# Patient Record
Sex: Male | Born: 1945 | Race: White | Marital: Married | Smoking: Former smoker
Health system: Southern US, Community
[De-identification: ages and names within clinical notes are randomized; demographics above are authoritative.]

## PROBLEM LIST (undated history)

## (undated) DIAGNOSIS — H269 Unspecified cataract: Secondary | ICD-10-CM

## (undated) DIAGNOSIS — E291 Testicular hypofunction: Secondary | ICD-10-CM

## (undated) DIAGNOSIS — F419 Anxiety disorder, unspecified: Secondary | ICD-10-CM

## (undated) DIAGNOSIS — K219 Gastro-esophageal reflux disease without esophagitis: Secondary | ICD-10-CM

## (undated) DIAGNOSIS — M5416 Radiculopathy, lumbar region: Secondary | ICD-10-CM

## (undated) DIAGNOSIS — Z87442 Personal history of urinary calculi: Secondary | ICD-10-CM

## (undated) DIAGNOSIS — E039 Hypothyroidism, unspecified: Secondary | ICD-10-CM

## (undated) DIAGNOSIS — E7800 Pure hypercholesterolemia, unspecified: Secondary | ICD-10-CM

## (undated) DIAGNOSIS — G2581 Restless legs syndrome: Secondary | ICD-10-CM

## (undated) DIAGNOSIS — I499 Cardiac arrhythmia, unspecified: Secondary | ICD-10-CM

## (undated) DIAGNOSIS — N189 Chronic kidney disease, unspecified: Secondary | ICD-10-CM

## (undated) DIAGNOSIS — I739 Peripheral vascular disease, unspecified: Secondary | ICD-10-CM

## (undated) DIAGNOSIS — I6529 Occlusion and stenosis of unspecified carotid artery: Secondary | ICD-10-CM

## (undated) DIAGNOSIS — I10 Essential (primary) hypertension: Secondary | ICD-10-CM

## (undated) DIAGNOSIS — J189 Pneumonia, unspecified organism: Secondary | ICD-10-CM

## (undated) DIAGNOSIS — K759 Inflammatory liver disease, unspecified: Secondary | ICD-10-CM

## (undated) DIAGNOSIS — D649 Anemia, unspecified: Secondary | ICD-10-CM

## (undated) DIAGNOSIS — E119 Type 2 diabetes mellitus without complications: Secondary | ICD-10-CM

## (undated) DIAGNOSIS — M545 Low back pain, unspecified: Secondary | ICD-10-CM

## (undated) DIAGNOSIS — M1990 Unspecified osteoarthritis, unspecified site: Secondary | ICD-10-CM

## (undated) DIAGNOSIS — N400 Enlarged prostate without lower urinary tract symptoms: Secondary | ICD-10-CM

## (undated) HISTORY — PX: KIDNEY STONE SURGERY: SHX686

## (undated) HISTORY — PX: PENILE PROSTHESIS IMPLANT: SHX240

## (undated) HISTORY — PX: EXTRACORPOREAL SHOCK WAVE LITHOTRIPSY: SHX1557

## (undated) HISTORY — PX: BACK SURGERY: SHX140

---

## 2020-01-01 ENCOUNTER — Other Ambulatory Visit: Payer: Self-pay | Admitting: Orthopedic Surgery

## 2020-01-14 ENCOUNTER — Other Ambulatory Visit: Payer: Self-pay | Admitting: Orthopedic Surgery

## 2020-01-14 DIAGNOSIS — M48062 Spinal stenosis, lumbar region with neurogenic claudication: Secondary | ICD-10-CM

## 2020-01-20 ENCOUNTER — Ambulatory Visit
Admission: RE | Admit: 2020-01-20 | Discharge: 2020-01-20 | Disposition: A | Discharge status: Home / Self Care | Payer: Medicare HMO | Source: Ambulatory Visit | Attending: Orthopedic Surgery | Admitting: Orthopedic Surgery

## 2020-01-20 ENCOUNTER — Other Ambulatory Visit: Payer: Self-pay | Admitting: Orthopedic Surgery

## 2020-01-20 DIAGNOSIS — M48062 Spinal stenosis, lumbar region with neurogenic claudication: Secondary | ICD-10-CM

## 2020-02-16 NOTE — Progress Notes (Signed)
[id number] - [place], [place] - [address] [place] [address] Phone: [phone] Fax: [phone]      Your procedure is scheduled on Wednesday, December 29th.  Report to [facility] Main Entrance "A" at 5:30 A.M., and check in at the Admitting office.  Call this number if you have problems the morning of surgery:  [phone]  Call [phone] if you have any questions prior to your surgery date Monday-Friday 8am-4pm    Remember:  Do not eat after midnight the night before your surgery  You may drink clear liquids until 4:30 AM the morning of your surgery.   Clear liquids allowed are: Water, Non-Citrus Juices (without pulp), Carbonated Beverages, Clear Tea, Black Coffee Only, and Gatorade  Please finish the bottled water by 4:30 AM, the morning of surgery.  Nothing else to drink after you finish the bottled water.     Take these medicines the morning of surgery with A SIP OF WATER   Tylenol - if needed  Allopurinol (Zyloprim)  Amlodipine (Norvasc)  Atorvastatin (Lipitor)  Carvedilol (Coreg)  Clonazepam (Klonopin)  Benadryl - if needed  Colace  Famotidine (Pepcid)  Hydralazine (Apresoline)  Doxazosin (Cardura)  Levothyroxine (Synthroid)  Narcan - if needed  Oxycodone - if needed for pain   Follow your surgeon's instructions on when to stop Aspirin & Plavix.  If no instructions were given by your surgeon then you will need to call the office to get those instructions.    As of today, STOP taking any Aspirin (unless otherwise instructed by your surgeon) Aleve, Naproxen, Ibuprofen, Motrin, Advil, Goody's, BC's, all herbal medications, fish oil, and all vitamins.   WHAT DO I DO ABOUT MY DIABETES MEDICATION?   Marland Kitchen Do not take oral diabetes medicines (pills) the morning of surgery. Glipizide (glucotrol) or Linagliptin (Tradjenta)   HOW TO MANAGE YOUR DIABETES BEFORE AND AFTER SURGERY  Why is it important to control my blood sugar  before and after surgery? . Improving blood sugar levels before and after surgery helps healing and can limit problems. . A way of improving blood sugar control is eating a healthy diet by: o  Eating less sugar and carbohydrates o  Increasing activity/exercise o  Talking with your doctor about reaching your blood sugar goals . High blood sugars (greater than 180 mg/dL) can raise your risk of infections and slow your recovery, so you will need to focus on controlling your diabetes during the weeks before surgery. . Make sure that the doctor who takes care of your diabetes knows about your planned surgery including the date and location.  How do I manage my blood sugar before surgery? . Check your blood sugar at least 4 times a day, starting 2 days before surgery, to make sure that the level is not too high or low. . Check your blood sugar the morning of your surgery when you wake up and every 2 hours until you get to the Short Stay unit. o If your blood sugar is less than 70 mg/dL, you will need to treat for low blood sugar: - Do not take insulin. - Treat a low blood sugar (less than 70 mg/dL) with  cup of clear juice (cranberry or apple), 4 glucose tablets, OR glucose gel. - Recheck blood sugar in 15 minutes after treatment (to make sure it is greater than 70 mg/dL). If your blood sugar is not greater than 70 mg/dL on recheck, call [phone] for further instructions. . Report your  blood sugar to the short stay nurse when you get to Short Stay.  . If you are admitted to the hospital after surgery: o Your blood sugar will be checked by the staff and you will probably be given insulin after surgery (instead of oral diabetes medicines) to make sure you have good blood sugar levels. o The goal for blood sugar control after surgery is 80-180 mg/dL.               DAY OF SURGERY:                    Do not wear jewelry            Do not wear lotions, powders, colognes, or deodorant.             Men may shave face and neck.            Do not bring valuables to the hospital.            [facility] is not responsible for any belongings or valuables.  Do NOT Smoke (Tobacco/Vaping) or drink Alcohol 24 hours prior to your procedure If you use a CPAP at night, you may bring all equipment for your overnight stay.   Contacts, glasses, dentures or bridgework may not be worn into surgery.      For patients admitted to the hospital, discharge time will be determined by your treatment team.   Patients discharged the day of surgery will not be allowed to drive home, and someone needs to stay with them for 24 hours.    Special instructions:   [place]- Preparing For Surgery  Before surgery, you can play an important role. Because skin is not sterile, your skin needs to be as free of germs as possible. You can reduce the number of germs on your skin by washing with CHG (chlorahexidine gluconate) Soap before surgery.  CHG is an antiseptic cleaner which kills germs and bonds with the skin to continue killing germs even after washing.    Oral Hygiene is also important to reduce your risk of infection.  Remember - BRUSH YOUR TEETH THE MORNING OF SURGERY WITH YOUR REGULAR TOOTHPASTE  Please do not use if you have an allergy to CHG or antibacterial soaps. If your skin becomes reddened/irritated stop using the CHG.  Do not shave (including legs and underarms) for at least 48 hours prior to first CHG shower. It is OK to shave your face.  Please follow these instructions carefully.   1. Shower the NIGHT BEFORE SURGERY and the MORNING OF SURGERY with CHG Soap.   2. If you chose to wash your hair, wash your hair first as usual with your normal shampoo.  3. After you shampoo, rinse your hair and body thoroughly to remove the shampoo.  4. Use CHG as you would any other liquid soap. You can apply CHG directly to the skin and wash gently with a scrungie or a clean washcloth.   5. Apply the CHG Soap  to your body ONLY FROM THE NECK DOWN.  Do not use on open wounds or open sores. Avoid contact with your eyes, ears, mouth and genitals (private parts). Wash Face and genitals (private parts)  with your normal soap.   6. Wash thoroughly, paying special attention to the area where your surgery will be performed.  7. Thoroughly rinse your body with warm water from the neck down.  8. DO NOT shower/wash with your normal soap  after using and rinsing off the CHG Soap.  9. Pat yourself dry with a CLEAN TOWEL.  10. Wear CLEAN PAJAMAS to bed the night before surgery  11. Place CLEAN SHEETS on your bed the night of your first shower and DO NOT SLEEP WITH PETS.   Day of Surgery: Wear Clean/Comfortable clothing the morning of surgery Do not apply any deodorants/lotions.   Remember to brush your teeth WITH YOUR REGULAR TOOTHPASTE.   Please read over the following fact sheets that you were given.

## 2020-02-17 ENCOUNTER — Encounter (HOSPITAL_COMMUNITY): Payer: Self-pay

## 2020-02-17 ENCOUNTER — Other Ambulatory Visit: Payer: Self-pay

## 2020-02-17 ENCOUNTER — Encounter (HOSPITAL_COMMUNITY)
Admission: RE | Admit: 2020-02-17 | Discharge: 2020-02-17 | Disposition: A | Discharge status: Home / Self Care | Payer: Medicare HMO | Source: Ambulatory Visit | Attending: Orthopedic Surgery | Admitting: Orthopedic Surgery

## 2020-02-17 DIAGNOSIS — Z01812 Encounter for preprocedural laboratory examination: Secondary | ICD-10-CM | POA: Diagnosis present

## 2020-02-17 HISTORY — DX: Restless legs syndrome: G25.81

## 2020-02-17 HISTORY — DX: Gastro-esophageal reflux disease without esophagitis: K21.9

## 2020-02-17 HISTORY — DX: Benign prostatic hyperplasia without lower urinary tract symptoms: N40.0

## 2020-02-17 HISTORY — DX: Pure hypercholesterolemia, unspecified: E78.00

## 2020-02-17 HISTORY — DX: Hypothyroidism, unspecified: E03.9

## 2020-02-17 HISTORY — DX: Low back pain, unspecified: M54.50

## 2020-02-17 HISTORY — DX: Unspecified osteoarthritis, unspecified site: M19.90

## 2020-02-17 HISTORY — DX: Anemia, unspecified: D64.9

## 2020-02-17 HISTORY — DX: Essential (primary) hypertension: I10

## 2020-02-17 HISTORY — DX: Chronic kidney disease, unspecified: N18.9

## 2020-02-17 HISTORY — DX: Occlusion and stenosis of unspecified carotid artery: I65.29

## 2020-02-17 HISTORY — DX: Peripheral vascular disease, unspecified: I73.9

## 2020-02-17 HISTORY — DX: Anxiety disorder, unspecified: F41.9

## 2020-02-17 HISTORY — DX: Pneumonia, unspecified organism: J18.9

## 2020-02-17 HISTORY — DX: Testicular hypofunction: E29.1

## 2020-02-17 HISTORY — DX: Radiculopathy, lumbar region: M54.16

## 2020-02-17 HISTORY — DX: Type 2 diabetes mellitus without complications: E11.9

## 2020-02-17 HISTORY — DX: Unspecified cataract: H26.9

## 2020-02-17 HISTORY — DX: Personal history of urinary calculi: Z87.442

## 2020-02-17 LAB — URINALYSIS, ROUTINE W REFLEX MICROSCOPIC
Bacteria, UA: NONE SEEN
Bilirubin Urine: NEGATIVE
Glucose, UA: NEGATIVE mg/dL
Hgb urine dipstick: NEGATIVE
Ketones, ur: NEGATIVE mg/dL
Leukocytes,Ua: NEGATIVE
Nitrite: NEGATIVE
Protein, ur: 100 mg/dL — AB
Specific Gravity, Urine: 1.017 (ref 1.005–1.030)
pH: 5 (ref 5.0–8.0)

## 2020-02-17 LAB — CBC WITH DIFFERENTIAL/PLATELET
Abs Immature Granulocytes: 0.02 10*3/uL (ref 0.00–0.07)
Basophils Absolute: 0 10*3/uL (ref 0.0–0.1)
Basophils Relative: 1 %
Eosinophils Absolute: 0.1 10*3/uL (ref 0.0–0.5)
Eosinophils Relative: 2 %
HCT: 34.5 % — ABNORMAL LOW (ref 39.0–52.0)
Hemoglobin: 10.7 g/dL — ABNORMAL LOW (ref 13.0–17.0)
Immature Granulocytes: 1 %
Lymphocytes Relative: 25 %
Lymphs Abs: 0.9 10*3/uL (ref 0.7–4.0)
MCH: 28.8 pg (ref 26.0–34.0)
MCHC: 31 g/dL (ref 30.0–36.0)
MCV: 92.7 fL (ref 80.0–100.0)
Monocytes Absolute: 0.3 10*3/uL (ref 0.1–1.0)
Monocytes Relative: 8 %
Neutro Abs: 2.3 10*3/uL (ref 1.7–7.7)
Neutrophils Relative %: 63 %
Platelets: 193 10*3/uL (ref 150–400)
RBC: 3.72 MIL/uL — ABNORMAL LOW (ref 4.22–5.81)
RDW: 14.7 % (ref 11.5–15.5)
WBC: 3.5 10*3/uL — ABNORMAL LOW (ref 4.0–10.5)
nRBC: 0 % (ref 0.0–0.2)

## 2020-02-17 LAB — TYPE AND SCREEN
ABO/RH(D): O POS
Antibody Screen: NEGATIVE

## 2020-02-17 LAB — COMPREHENSIVE METABOLIC PANEL
ALT: 38 U/L (ref 0–44)
AST: 46 U/L — ABNORMAL HIGH (ref 15–41)
Albumin: 3.4 g/dL — ABNORMAL LOW (ref 3.5–5.0)
Alkaline Phosphatase: 59 U/L (ref 38–126)
Anion gap: 9 (ref 5–15)
BUN: 26 mg/dL — ABNORMAL HIGH (ref 8–23)
CO2: 25 mmol/L (ref 22–32)
Calcium: 8.9 mg/dL (ref 8.9–10.3)
Chloride: 105 mmol/L (ref 98–111)
Creatinine, Ser: 2.6 mg/dL — ABNORMAL HIGH (ref 0.61–1.24)
GFR, Estimated: 25 mL/min — ABNORMAL LOW (ref >60)
Glucose, Bld: 156 mg/dL — ABNORMAL HIGH (ref 70–99)
Potassium: 4.5 mmol/L (ref 3.5–5.1)
Sodium: 139 mmol/L (ref 135–145)
Total Bilirubin: 0.4 mg/dL (ref 0.3–1.2)
Total Protein: 6.1 g/dL — ABNORMAL LOW (ref 6.5–8.1)

## 2020-02-17 LAB — PROTIME-INR
INR: 1 (ref 0.8–1.2)
Prothrombin Time: 13.1 seconds (ref 11.4–15.2)

## 2020-02-17 LAB — SURGICAL PCR SCREEN
MRSA, PCR: NEGATIVE
Staphylococcus aureus: NEGATIVE

## 2020-02-17 LAB — GLUCOSE, CAPILLARY: Glucose-Capillary: 136 mg/dL — ABNORMAL HIGH (ref 70–99)

## 2020-02-17 LAB — APTT: aPTT: 32 seconds (ref 24–36)

## 2020-02-17 NOTE — Progress Notes (Signed)
PCP - Dr. Vista Lawman Cardiologist - patient states he no longer sees a cardiologist Vascular - Sonia Baller Christman Pain - Dr. Ulice Brilliant  PPM/ICD - n/a Device Orders -  Rep Notified -   Chest x-ray - n/a EKG - 03/10/19 in [facility], requested tracing Stress Test - 2019 ECHO - 2019 Cardiac Cath - patient states he possibly had one 15+ years ago but is not sure, thinks it was done in [place] - requested records from [facility]  Sleep Study - n/a CPAP -   Fasting Blood Sugar - patient does not check CBG.  Last A1c was 01/05/2020 6.2 Checks Blood Sugar _____ times a day  Blood Thinner Instructions: stop plavix 7 days prior to surgery Aspirin Instructions: stop aspirin 7 days prior to surgery  ERAS Protcol - clears until 0430 PRE-SURGERY Ensure or G2- water given to patient to be completed by 0430 DOS  COVID TEST- scheduled for 02/22/2020, patient educated about quarantining process   Anesthesia review: yes, history of cardiac testing  Patient denies shortness of breath, fever, cough and chest pain at PAT appointment   All instructions explained to the patient, with a verbal understanding of the material. Patient agrees to go over the instructions while at home for a better understanding. Patient also instructed to self quarantine after being tested for COVID-19. The opportunity to ask questions was provided.

## 2020-02-18 NOTE — Progress Notes (Signed)
Anesthesia Chart Review:  Case: [id number] Date/Time: 02/24/20 0715   Procedure: LEFT LATERAL LUMBAR 3-LUMBAR 4 INTERBODY FUSION WITH INSTRUMENTATION AND ALLOGRAFT (Left ) - 3 C BED   Anesthesia type: General   Pre-op diagnosis: NEUROGENIC CLAUDICATION SECONDARY TO SUPERIOR ADJACENT SEGMENT MODERATE TO SEVERE SPINAL STENOSIS   Location: [facility] OR ROOM 05 / [place] OR   Surgeons: Phylliss Bob, MD      DISCUSSION: Patient is a 74 year old male scheduled for the above procedure.  He is also scheduled for posterior spinal fusion L3-4 with instrumentation allograft on 02/25/2020.  History includes former smoker, HTN, PAD/claudication (s/p right SFA stent), carotid artery stenosis (60-79% BICA 11/2019, 6 month f/u), hypercholesterolemia, DM2, GERD, hypothyroidism, CKD, anemia, BPH, penile prosthesis implant, RLS, back surgery (revision L4-S1 laminectomy and PSF 08/16/18, Ivan Croft, MD), + COVID 02/14/19.   He reported instructions to hold Plavix and aspirin 7 days prior to surgery.  Cr 2.60, but consistent with known CKD/previous labs since 09/2019.  Presurgical COVID-19 test is scheduled for 02/22/2020.  Anesthesia team to evaluate on the day of surgery.   VS: BP 130/60   Pulse 94   Temp 36.7 C   Resp 18   Ht 5' 10"  (1.778 m)   Wt 94.2 kg   SpO2 98%   BMI 29.80 kg/m    PROVIDERS: Haimes, Youlanda Roys, MD his PCP [facility]).  Last visit 02/05/2020 for increasing anxiety in regards to upcoming back surgery.  Clonazepam increased from twice daily to 3 times daily. Charlane Ferretti, [place], PA-C with vascular surgeon [facility]). Last visit 12/03/19 for follow-up PAD, carotid disease. 6 month follow-up planned.Kriste Basque, MD Pain management - Linnell Fulling, MD is nephrologist [facility]).  Last visit 01/05/2020 for follow-up CKD stage IIIb.   LABS: Preoperative labs noted. Cr 2.60, but consistent with known CKD/prior labs since 09/2019. H/H 10.7/34.5, down from 11.8/35.4 on 01/05/20. T&S  ordered. A1c 6.2% 01/05/20 ([facility]).  (all labs ordered are listed, but only abnormal results are displayed)  Labs Reviewed  GLUCOSE, CAPILLARY - Abnormal; Notable for the following components:      Result Value   Glucose-Capillary 136 (*)    All other components within normal limits  CBC WITH DIFFERENTIAL/PLATELET - Abnormal; Notable for the following components:   WBC 3.5 (*)    RBC 3.72 (*)    Hemoglobin 10.7 (*)    HCT 34.5 (*)    All other components within normal limits  COMPREHENSIVE METABOLIC PANEL - Abnormal; Notable for the following components:   Glucose, Bld 156 (*)    BUN 26 (*)    Creatinine, Ser 2.60 (*)    Total Protein 6.1 (*)    Albumin 3.4 (*)    AST 46 (*)    GFR, Estimated 25 (*)    All other components within normal limits  URINALYSIS, ROUTINE W REFLEX MICROSCOPIC - Abnormal; Notable for the following components:   Protein, ur 100 (*)    All other components within normal limits  SURGICAL PCR SCREEN  PROTIME-INR  APTT  TYPE AND SCREEN   01/05/20 Cr 2.35, BUN 34, eGFR 26/#1  10/16/19 Cr 2.53, BUN 34, eGFR 24/28  10/02/19 Cr 2.61, BUN 35, eGFR 23   IMAGES: CT L-spine 01/20/20: IMPRESSION: - Status post L4-S1 fusion. Hardware is intact and there is some incorporation of interbody spacers into the endplates. Endplate spurring results in moderate to moderately severe bilateral foraminal narrowing at L5-S1, worse on the left.  There is also mild to moderate foraminal narrowing at L4-5. The central canal is open at both levels. - Moderately severe central canal and mild bilateral foraminal narrowing at L3-4. - Moderate central canal stenosis L2-3. - Aortic Atherosclerosis (ICD10-I70.0).  MRI L-spine 11/30/19: IMPRESSION:  1. Interval postsurgical changes from L4-S1 PLIF. No residual canal  stenosis at these levels. Suspect at least mild residual foraminal  stenosis at L5-S1, left worse than right, although evaluation of  which is limited  by adjacent susceptibility artifact.  2. Moderate-to-severe canal stenosis and mild-to-moderate bilateral  foraminal stenosis at L3-L4, progressed from prior.  3. Mild canal stenosis at L2-L3 with mild bilateral subarticular  recess stenosis, minimally progressed from prior.    CT Chest 03/20/19 [facility]) IMPRESSION:  1. The appearance of the lungs is most compatible with a resolving  multilobar pneumonia, likely from a viral infection, with areas of  developing post infectious fibrosis, as detailed above.  2. Aortic atherosclerosis, in addition to left main and 3 vessel  coronary artery disease. Assessment for potential risk factor  modification, dietary therapy or pharmacologic therapy may be  warranted, if clinically indicated.    EKG: 03/10/2019 [facility]): Sinus rhythm   CV: Carotid US 12/03/19: As outlined by Lynwood Dawley, PA-C, "Carotid duplex scan reveals a 60 to 79% stenosis of the bilateral internal carotid arteries. This is a progression on the right side but I feel based on his velocities that he is at the lower end of this range. His vertebrals are antegrade and his brachial pressures are symmetric." Six month follow-up planned.   LE Arterial US 06/02/19:As outlined by Charlane Ferretti, [place], PA-C, "Lower extremity arterial imaging on the right reveals the right mid to distal SFA stent to be patent with velocities of 278 cm/s which is stable. ABIs are also stable at 0.85 on the right and 0.71 on the left." 1 year follow-up planned.   Stress Echo 11/01/17 [facility]): Global LVEF (rest): Normal (LVEF >50%)  Global LVEF (stress): Hyperkinetic (LVEF >70%)  Conclusions  Summary  Normal Stress Echocardiogram with no ischemia suggested by echo images    According to 10/21/17 cardiology consult note by Conrad Olympia Fields, MD [facility]), cardiac cath ~ 2004 showed "minor CAD". Stress echo was ordered for chest "aching" and was normal 11/01/17.      Past Medical History:  Diagnosis  Date  . Anemia   . Anxiety   . BPH (benign prostatic hyperplasia)   . Carotid artery stenosis   . Cataracts, bilateral   . Chronic kidney disease   . Diabetes mellitus without complication ([place])   . GERD (gastroesophageal reflux disease)   . History of kidney stones   . Hypercholesteremia   . Hypertension   . Hypogonadism in male   . Hypothyroidism   . Low back pain   . Lumbar radiculopathy   . Osteoarthritis   . Peripheral vascular disease ([place])   . Pneumonia   . RLS (restless legs syndrome)     Past Surgical History:  Procedure Laterality Date  . BACK SURGERY     x4  . EXTRACORPOREAL SHOCK WAVE LITHOTRIPSY    . KIDNEY STONE SURGERY    . PENILE PROSTHESIS IMPLANT      MEDICATIONS: . acetaminophen (TYLENOL) 500 MG tablet  . allopurinol (ZYLOPRIM) 100 MG tablet  . amLODipine (NORVASC) 10 MG tablet  . APPLE CIDER VINEGAR PO  . aspirin EC 81 MG tablet  . atorvastatin (LIPITOR) 80 MG tablet  . carvedilol (COREG)  25 MG tablet  . clonazePAM (KLONOPIN) 1 MG tablet  . clopidogrel (PLAVIX) 75 MG tablet  . diphenhydrAMINE (BENADRYL) 25 MG tablet  . docusate sodium (COLACE) 100 MG capsule  . doxazosin (CARDURA) 4 MG tablet  . famotidine (PEPCID) 20 MG tablet  . fenofibrate 160 MG tablet  . fentaNYL (DURAGESIC) 12 MCG/HR  . fentaNYL (DURAGESIC) 25 MCG/HR  . ferrous sulfate 325 (65 FE) MG tablet  . FIBER PO  . glipiZIDE (GLUCOTROL XL) 10 MG 24 hr tablet  . hydrALAZINE (APRESOLINE) 25 MG tablet  . levothyroxine (SYNTHROID) 75 MCG tablet  . linagliptin (TRADJENTA) 5 MG TABS tablet  . lisinopril (ZESTRIL) 40 MG tablet  . Magnesium 400 MG TABS  . Melatonin 5 MG CAPS  . Multiple Vitamin (MULTIVITAMIN WITH MINERALS) TABS tablet  . naloxone (NARCAN) 4 MG/0.1ML LIQD nasal spray kit  . Omega-3 Fatty Acids (FISH OIL) 1000 MG CAPS  . Oxycodone HCl 10 MG TABS  . Potassium Citrate 15 MEQ (1620 MG) TBCR  . Testosterone 1.62 % GEL  . traZODone (DESYREL) 50 MG tablet  . TURMERIC PO    No current facility-administered medications for this encounter.    Myra Gianotti, PA-C Surgical Short Stay/Anesthesiology [facility] Phone [phone] [facility] Phone [phone] 02/18/2020 4:07 PM

## 2020-02-18 NOTE — Anesthesia Preprocedure Evaluation (Addendum)
Anesthesia Evaluation  Patient identified by MRN, date of birth, ID band Patient awake    Reviewed: Allergy & Precautions, NPO status , Patient's Chart, lab work & pertinent test results  Airway Mallampati: I  TM Distance: >3 FB Neck ROM: Full    Dental no notable dental hx. (+) Upper Dentures, Lower Dentures   Pulmonary former smoker,    Pulmonary exam normal breath sounds clear to auscultation       Cardiovascular Exercise Tolerance: Good hypertension, Pt. on medications + CAD and + Peripheral Vascular Disease  Normal cardiovascular exam Rhythm:Regular Rate:Normal  NL EF   Neuro/Psych Anxiety  Neuromuscular disease    GI/Hepatic Neg liver ROS, GERD  ,  Endo/Other  diabetes, Type 2Hypothyroidism   Renal/GU Renal InsufficiencyRenal diseaseCr 2.60 K+ 4.5     Musculoskeletal  (+) Arthritis ,   Abdominal   Peds  Hematology  (+) anemia , Hgb 10.7   Anesthesia Other Findings   Reproductive/Obstetrics                           Anesthesia Physical Anesthesia Plan  ASA: III  Anesthesia Plan: General   Post-op Pain Management:    Induction: Intravenous  PONV Risk Score and Plan: 3 and Treatment may vary due to age or medical condition, Ondansetron, Dexamethasone, TIVA and Midazolam  Airway Management Planned: Oral ETT  Additional Equipment: None and Arterial line  Intra-op Plan:   Post-operative Plan: Extubation in OR  Informed Consent: I have reviewed the patients History and Physical, chart, labs and discussed the procedure including the risks, benefits and alternatives for the proposed anesthesia with the patient or authorized representative who has indicated his/her understanding and acceptance.     Dental advisory given and Interpreter used for interveiw  Plan Discussed with: CRNA and Anesthesiologist  Anesthesia Plan Comments: (PAT note written 02/18/2020 by Myra Gianotti, PA-C.  2 large IV + Aline)    Anesthesia Quick Evaluation

## 2020-02-22 ENCOUNTER — Other Ambulatory Visit (HOSPITAL_COMMUNITY)
Admission: RE | Admit: 2020-02-22 | Discharge: 2020-02-22 | Disposition: A | Discharge status: Home / Self Care | Payer: Medicare HMO | Source: Ambulatory Visit | Attending: Orthopedic Surgery | Admitting: Orthopedic Surgery

## 2020-02-22 DIAGNOSIS — Z20822 Contact with and (suspected) exposure to covid-19: Secondary | ICD-10-CM | POA: Insufficient documentation

## 2020-02-22 DIAGNOSIS — Z01812 Encounter for preprocedural laboratory examination: Secondary | ICD-10-CM | POA: Insufficient documentation

## 2020-02-22 LAB — SARS CORONAVIRUS 2 (TAT 6-24 HRS): SARS Coronavirus 2: NEGATIVE

## 2020-02-24 ENCOUNTER — Inpatient Hospital Stay (HOSPITAL_COMMUNITY)
Admission: RE | Disposition: A | Discharge status: Home / Self Care | Payer: Self-pay | Source: Home / Self Care | Attending: Orthopedic Surgery

## 2020-02-24 ENCOUNTER — Inpatient Hospital Stay (HOSPITAL_COMMUNITY): Payer: Medicare HMO | Admitting: Vascular Surgery

## 2020-02-24 ENCOUNTER — Inpatient Hospital Stay (HOSPITAL_COMMUNITY): Payer: Medicare HMO

## 2020-02-24 ENCOUNTER — Inpatient Hospital Stay (HOSPITAL_COMMUNITY)
Admission: RE | Admit: 2020-02-24 | Discharge: 2020-02-26 | DRG: 455 | Disposition: A | Discharge status: Home / Self Care | Payer: Medicare HMO | Attending: Orthopedic Surgery | Admitting: Orthopedic Surgery

## 2020-02-24 ENCOUNTER — Other Ambulatory Visit: Payer: Self-pay

## 2020-02-24 ENCOUNTER — Inpatient Hospital Stay (HOSPITAL_COMMUNITY): Payer: Medicare HMO | Admitting: Certified Registered Nurse Anesthetist

## 2020-02-24 ENCOUNTER — Encounter (HOSPITAL_COMMUNITY): Payer: Self-pay | Admitting: Orthopedic Surgery

## 2020-02-24 DIAGNOSIS — M5416 Radiculopathy, lumbar region: Secondary | ICD-10-CM | POA: Diagnosis present

## 2020-02-24 DIAGNOSIS — Z981 Arthrodesis status: Secondary | ICD-10-CM | POA: Diagnosis not present

## 2020-02-24 DIAGNOSIS — Z885 Allergy status to narcotic agent status: Secondary | ICD-10-CM

## 2020-02-24 DIAGNOSIS — E291 Testicular hypofunction: Secondary | ICD-10-CM | POA: Diagnosis present

## 2020-02-24 DIAGNOSIS — N400 Enlarged prostate without lower urinary tract symptoms: Secondary | ICD-10-CM | POA: Diagnosis present

## 2020-02-24 DIAGNOSIS — Z20822 Contact with and (suspected) exposure to covid-19: Secondary | ICD-10-CM | POA: Diagnosis present

## 2020-02-24 DIAGNOSIS — E1122 Type 2 diabetes mellitus with diabetic chronic kidney disease: Secondary | ICD-10-CM | POA: Diagnosis present

## 2020-02-24 DIAGNOSIS — E1151 Type 2 diabetes mellitus with diabetic peripheral angiopathy without gangrene: Secondary | ICD-10-CM | POA: Diagnosis present

## 2020-02-24 DIAGNOSIS — Z7984 Long term (current) use of oral hypoglycemic drugs: Secondary | ICD-10-CM | POA: Diagnosis not present

## 2020-02-24 DIAGNOSIS — M48062 Spinal stenosis, lumbar region with neurogenic claudication: Principal | ICD-10-CM | POA: Diagnosis present

## 2020-02-24 DIAGNOSIS — E039 Hypothyroidism, unspecified: Secondary | ICD-10-CM | POA: Diagnosis present

## 2020-02-24 DIAGNOSIS — K219 Gastro-esophageal reflux disease without esophagitis: Secondary | ICD-10-CM | POA: Diagnosis present

## 2020-02-24 DIAGNOSIS — M62838 Other muscle spasm: Secondary | ICD-10-CM | POA: Diagnosis present

## 2020-02-24 DIAGNOSIS — Z91041 Radiographic dye allergy status: Secondary | ICD-10-CM | POA: Diagnosis not present

## 2020-02-24 DIAGNOSIS — I129 Hypertensive chronic kidney disease with stage 1 through stage 4 chronic kidney disease, or unspecified chronic kidney disease: Secondary | ICD-10-CM | POA: Diagnosis present

## 2020-02-24 DIAGNOSIS — Z888 Allergy status to other drugs, medicaments and biological substances status: Secondary | ICD-10-CM

## 2020-02-24 DIAGNOSIS — N189 Chronic kidney disease, unspecified: Secondary | ICD-10-CM | POA: Diagnosis present

## 2020-02-24 DIAGNOSIS — Z87891 Personal history of nicotine dependence: Secondary | ICD-10-CM

## 2020-02-24 DIAGNOSIS — Z79899 Other long term (current) drug therapy: Secondary | ICD-10-CM

## 2020-02-24 DIAGNOSIS — Z79890 Hormone replacement therapy (postmenopausal): Secondary | ICD-10-CM | POA: Diagnosis not present

## 2020-02-24 DIAGNOSIS — F419 Anxiety disorder, unspecified: Secondary | ICD-10-CM | POA: Diagnosis present

## 2020-02-24 DIAGNOSIS — M1990 Unspecified osteoarthritis, unspecified site: Secondary | ICD-10-CM | POA: Diagnosis present

## 2020-02-24 DIAGNOSIS — H269 Unspecified cataract: Secondary | ICD-10-CM | POA: Diagnosis present

## 2020-02-24 DIAGNOSIS — D649 Anemia, unspecified: Secondary | ICD-10-CM | POA: Diagnosis present

## 2020-02-24 DIAGNOSIS — Z419 Encounter for procedure for purposes other than remedying health state, unspecified: Secondary | ICD-10-CM

## 2020-02-24 HISTORY — PX: ANTERIOR LAT LUMBAR FUSION: SHX1168

## 2020-02-24 LAB — GLUCOSE, CAPILLARY
Glucose-Capillary: 82 mg/dL (ref 70–99)
Glucose-Capillary: 95 mg/dL (ref 70–99)
Glucose-Capillary: 199 mg/dL — ABNORMAL HIGH (ref 70–99)
Glucose-Capillary: 158 mg/dL — ABNORMAL HIGH (ref 70–99)

## 2020-02-24 LAB — ABO/RH: ABO/RH(D): O POS

## 2020-02-24 SURGERY — ANTERIOR LATERAL LUMBAR FUSION 1 LEVEL
Anesthesia: General | Site: Spine Lumbar | Laterality: Left

## 2020-02-24 MED ORDER — OXYCODONE HCL 5 MG PO TABS
10.0000 mg | ORAL_TABLET | ORAL | Status: DC | PRN
  Administered 2020-02-24 – 2020-02-26 (×7): 10 mg via ORAL
  Filled 2020-02-24 (×7): qty 2

## 2020-02-24 MED ORDER — DOCUSATE SODIUM 100 MG PO CAPS
100.0000 mg | ORAL_CAPSULE | Freq: Two times a day (BID) | ORAL | Status: DC
  Administered 2020-02-24 – 2020-02-25 (×3): 100 mg via ORAL
  Filled 2020-02-24 (×3): qty 1

## 2020-02-24 MED ORDER — CARVEDILOL 25 MG PO TABS
25.0000 mg | ORAL_TABLET | Freq: Two times a day (BID) | ORAL | Status: DC
  Administered 2020-02-24 – 2020-02-26 (×4): 25 mg via ORAL
  Filled 2020-02-24 (×3): qty 1

## 2020-02-24 MED ORDER — POTASSIUM CITRATE ER 15 MEQ (1620 MG) PO TBCR: 1.0000 | EXTENDED_RELEASE_TABLET | Freq: Every day | ORAL | Status: DC

## 2020-02-24 MED ORDER — EPHEDRINE SULFATE-NACL 50-0.9 MG/10ML-% IV SOSY
PREFILLED_SYRINGE | INTRAVENOUS | Status: DC | PRN
  Administered 2020-02-24: 15 mg via INTRAVENOUS
  Administered 2020-02-24: 5 mg via INTRAVENOUS

## 2020-02-24 MED ORDER — ADULT MULTIVITAMIN W/MINERALS CH: 1.0000 | ORAL_TABLET | Freq: Every day | ORAL | Status: DC

## 2020-02-24 MED ORDER — THROMBIN 20000 UNITS EX KIT
PACK | CUTANEOUS | Status: AC
  Filled 2020-02-24: qty 1

## 2020-02-24 MED ORDER — ACETAMINOPHEN 10 MG/ML IV SOLN
INTRAVENOUS | Status: AC
  Administered 2020-02-24: 1000 mg via INTRAVENOUS
  Filled 2020-02-24: qty 100

## 2020-02-24 MED ORDER — SODIUM CHLORIDE 0.9% FLUSH
3.0000 mL | Freq: Two times a day (BID) | INTRAVENOUS | Status: DC
  Administered 2020-02-24 – 2020-02-25 (×3): 3 mL via INTRAVENOUS

## 2020-02-24 MED ORDER — ATORVASTATIN CALCIUM 80 MG PO TABS
80.0000 mg | ORAL_TABLET | Freq: Every day | ORAL | Status: DC
  Administered 2020-02-24 – 2020-02-25 (×2): 80 mg via ORAL
  Filled 2020-02-24 (×2): qty 1

## 2020-02-24 MED ORDER — ALUM & MAG HYDROXIDE-SIMETH 200-200-20 MG/5ML PO SUSP: 30.0000 mL | Freq: Four times a day (QID) | ORAL | Status: DC | PRN

## 2020-02-24 MED ORDER — ONDANSETRON HCL 4 MG PO TABS: 4.0000 mg | ORAL_TABLET | Freq: Four times a day (QID) | ORAL | Status: DC | PRN

## 2020-02-24 MED ORDER — BUPIVACAINE-EPINEPHRINE 0.25% -1:200000 IJ SOLN
INTRAMUSCULAR | Status: DC | PRN
  Administered 2020-02-24: 10 mL

## 2020-02-24 MED ORDER — PHENYLEPHRINE HCL (PRESSORS) 10 MG/ML IV SOLN
INTRAVENOUS | Status: DC | PRN
  Administered 2020-02-24: 40 ug via INTRAVENOUS
  Administered 2020-02-24: 50 ug via INTRAVENOUS

## 2020-02-24 MED ORDER — FLEET ENEMA 7-19 GM/118ML RE ENEM: 1.0000 | ENEMA | Freq: Once | RECTAL | Status: DC | PRN

## 2020-02-24 MED ORDER — PROPOFOL 10 MG/ML IV BOLUS
INTRAVENOUS | Status: AC
  Filled 2020-02-24: qty 40

## 2020-02-24 MED ORDER — PROPOFOL 10 MG/ML IV BOLUS
INTRAVENOUS | Status: DC | PRN
  Administered 2020-02-24: 30 mg via INTRAVENOUS
  Administered 2020-02-24: 130 mg via INTRAVENOUS

## 2020-02-24 MED ORDER — HYDROMORPHONE HCL 1 MG/ML IJ SOLN
INTRAMUSCULAR | Status: AC
  Administered 2020-02-24: 0.5 mg via INTRAVENOUS
  Filled 2020-02-24: qty 1

## 2020-02-24 MED ORDER — DIPHENHYDRAMINE HCL 25 MG PO CAPS: 25.0000 mg | ORAL_CAPSULE | Freq: Every day | ORAL | Status: DC | PRN

## 2020-02-24 MED ORDER — TRAZODONE HCL 100 MG PO TABS
100.0000 mg | ORAL_TABLET | Freq: Every day | ORAL | Status: DC
  Administered 2020-02-24 – 2020-02-25 (×2): 100 mg via ORAL
  Filled 2020-02-24 (×3): qty 1

## 2020-02-24 MED ORDER — MENTHOL 3 MG MT LOZG: 1.0000 | LOZENGE | OROMUCOSAL | Status: DC | PRN

## 2020-02-24 MED ORDER — NALOXONE HCL 4 MG/0.1ML NA LIQD: 1.0000 | NASAL | Status: DC | PRN

## 2020-02-24 MED ORDER — BUPIVACAINE LIPOSOME 1.3 % IJ SUSP
20.0000 mL | Freq: Once | INTRAMUSCULAR | Status: AC
  Administered 2020-02-25: 20 mL
  Filled 2020-02-24: qty 20

## 2020-02-24 MED ORDER — TESTOSTERONE 50 MG/5GM (1%) TD GEL: 5.0000 g | TRANSDERMAL | Status: DC

## 2020-02-24 MED ORDER — OXYCODONE HCL ER 10 MG PO T12A
10.0000 mg | EXTENDED_RELEASE_TABLET | Freq: Two times a day (BID) | ORAL | Status: DC
  Administered 2020-02-24 – 2020-02-25 (×3): 10 mg via ORAL
  Filled 2020-02-24 (×3): qty 1

## 2020-02-24 MED ORDER — DOCUSATE SODIUM 100 MG PO CAPS: 300.0000 mg | ORAL_CAPSULE | Freq: Every day | ORAL | Status: DC

## 2020-02-24 MED ORDER — FENTANYL CITRATE (PF) 250 MCG/5ML IJ SOLN
INTRAMUSCULAR | Status: DC | PRN
  Administered 2020-02-24: 100 ug via INTRAVENOUS
  Administered 2020-02-24 (×3): 50 ug via INTRAVENOUS

## 2020-02-24 MED ORDER — DEXAMETHASONE SODIUM PHOSPHATE 10 MG/ML IJ SOLN
INTRAMUSCULAR | Status: DC | PRN
  Administered 2020-02-24: 5 mg via INTRAVENOUS

## 2020-02-24 MED ORDER — FERROUS SULFATE 325 (65 FE) MG PO TABS
325.0000 mg | ORAL_TABLET | Freq: Every day | ORAL | Status: DC
Start: 2020-02-24 — End: 2020-02-26
  Administered 2020-02-24 – 2020-02-25 (×2): 325 mg via ORAL
  Filled 2020-02-24 (×2): qty 1

## 2020-02-24 MED ORDER — ONDANSETRON HCL 4 MG/2ML IJ SOLN: 4.0000 mg | Freq: Four times a day (QID) | INTRAMUSCULAR | Status: DC | PRN

## 2020-02-24 MED ORDER — TURMERIC 500 MG PO CAPS: ORAL_CAPSULE | Freq: Every day | ORAL | Status: DC

## 2020-02-24 MED ORDER — PHENOL 1.4 % MT LIQD: 1.0000 | OROMUCOSAL | Status: DC | PRN

## 2020-02-24 MED ORDER — LACTATED RINGERS IV SOLN: INTRAVENOUS | Status: DC | PRN

## 2020-02-24 MED ORDER — CEFAZOLIN SODIUM-DEXTROSE 2-4 GM/100ML-% IV SOLN
2.0000 g | INTRAVENOUS | Status: AC
  Administered 2020-02-24: 2 g via INTRAVENOUS
  Filled 2020-02-24: qty 100

## 2020-02-24 MED ORDER — BUPIVACAINE-EPINEPHRINE (PF) 0.25% -1:200000 IJ SOLN
INTRAMUSCULAR | Status: AC
  Filled 2020-02-24: qty 30

## 2020-02-24 MED ORDER — HYDRALAZINE HCL 10 MG PO TABS
25.0000 mg | ORAL_TABLET | Freq: Three times a day (TID) | ORAL | Status: DC
  Administered 2020-02-24 – 2020-02-25 (×6): 25 mg via ORAL
  Filled 2020-02-24 (×3): qty 1
  Filled 2020-02-24 (×3): qty 3

## 2020-02-24 MED ORDER — FENTANYL 25 MCG/HR TD PT72
1.0000 | MEDICATED_PATCH | TRANSDERMAL | Status: DC
  Administered 2020-02-24: 1 via TRANSDERMAL
  Filled 2020-02-24: qty 1

## 2020-02-24 MED ORDER — SENNOSIDES-DOCUSATE SODIUM 8.6-50 MG PO TABS
1.0000 | ORAL_TABLET | Freq: Every evening | ORAL | Status: DC | PRN
  Administered 2020-02-25: 1 via ORAL
  Filled 2020-02-24: qty 1

## 2020-02-24 MED ORDER — ONDANSETRON HCL 4 MG/2ML IJ SOLN
INTRAMUSCULAR | Status: DC | PRN
  Administered 2020-02-24: 4 mg via INTRAVENOUS

## 2020-02-24 MED ORDER — ALLOPURINOL 100 MG PO TABS
100.0000 mg | ORAL_TABLET | Freq: Every day | ORAL | Status: DC
  Administered 2020-02-25: 100 mg via ORAL
  Filled 2020-02-24 (×2): qty 1

## 2020-02-24 MED ORDER — GLYCOPYRROLATE 0.2 MG/ML IJ SOLN
INTRAMUSCULAR | Status: DC | PRN
  Administered 2020-02-24: .2 mg via INTRAVENOUS

## 2020-02-24 MED ORDER — OXYCODONE HCL 5 MG PO TABS
ORAL_TABLET | ORAL | Status: AC
  Administered 2020-02-24: 5 mg via ORAL
  Filled 2020-02-24: qty 1

## 2020-02-24 MED ORDER — OXYCODONE HCL 5 MG/5ML PO SOLN: 5.0000 mg | Freq: Once | ORAL | Status: AC | PRN

## 2020-02-24 MED ORDER — CHLORHEXIDINE GLUCONATE 0.12 % MT SOLN
15.0000 mL | Freq: Once | OROMUCOSAL | Status: AC
  Administered 2020-02-24: 15 mL via OROMUCOSAL
  Filled 2020-02-24: qty 15

## 2020-02-24 MED ORDER — MELATONIN 5 MG PO TABS
5.0000 mg | ORAL_TABLET | Freq: Every day | ORAL | Status: DC
  Administered 2020-02-24 – 2020-02-25 (×2): 5 mg via ORAL
  Filled 2020-02-24 (×2): qty 1

## 2020-02-24 MED ORDER — LACTATED RINGERS IV SOLN: INTRAVENOUS | Status: DC

## 2020-02-24 MED ORDER — FAMOTIDINE 20 MG PO TABS
40.0000 mg | ORAL_TABLET | Freq: Every day | ORAL | Status: DC
  Administered 2020-02-25: 40 mg via ORAL
  Filled 2020-02-24: qty 2

## 2020-02-24 MED ORDER — CEFAZOLIN SODIUM-DEXTROSE 2-4 GM/100ML-% IV SOLN
2.0000 g | Freq: Three times a day (TID) | INTRAVENOUS | Status: AC
  Administered 2020-02-24 (×2): 2 g via INTRAVENOUS
  Filled 2020-02-24 (×2): qty 100

## 2020-02-24 MED ORDER — LIDOCAINE 2% (20 MG/ML) 5 ML SYRINGE
INTRAMUSCULAR | Status: DC | PRN
  Administered 2020-02-24: 100 mg via INTRAVENOUS

## 2020-02-24 MED ORDER — POVIDONE-IODINE 7.5 % EX SOLN
Freq: Once | CUTANEOUS | Status: DC
  Filled 2020-02-24: qty 118

## 2020-02-24 MED ORDER — HYDROMORPHONE HCL 1 MG/ML IJ SOLN
0.2500 mg | INTRAMUSCULAR | Status: DC | PRN
Start: 2020-02-24 — End: 2020-02-24
  Administered 2020-02-24 (×2): 0.25 mg via INTRAVENOUS

## 2020-02-24 MED ORDER — THROMBIN 20000 UNITS EX SOLR
CUTANEOUS | Status: DC | PRN
  Administered 2020-02-24: 20 mL via TOPICAL

## 2020-02-24 MED ORDER — ACETAMINOPHEN 10 MG/ML IV SOLN: 1000.0000 mg | Freq: Once | INTRAVENOUS | Status: DC | PRN

## 2020-02-24 MED ORDER — AMLODIPINE BESYLATE 5 MG PO TABS
10.0000 mg | ORAL_TABLET | Freq: Every day | ORAL | Status: DC
  Administered 2020-02-25: 10 mg via ORAL
  Filled 2020-02-24: qty 2

## 2020-02-24 MED ORDER — FENTANYL CITRATE (PF) 250 MCG/5ML IJ SOLN
INTRAMUSCULAR | Status: AC
  Filled 2020-02-24: qty 5

## 2020-02-24 MED ORDER — SODIUM CHLORIDE 0.9% FLUSH: 3.0000 mL | INTRAVENOUS | Status: DC | PRN

## 2020-02-24 MED ORDER — DOXAZOSIN MESYLATE 4 MG PO TABS
4.0000 mg | ORAL_TABLET | Freq: Two times a day (BID) | ORAL | Status: DC
  Administered 2020-02-24 – 2020-02-25 (×3): 4 mg via ORAL
  Filled 2020-02-24 (×5): qty 1

## 2020-02-24 MED ORDER — GLIPIZIDE ER 10 MG PO TB24
10.0000 mg | ORAL_TABLET | Freq: Every day | ORAL | Status: DC
Start: 2020-02-24 — End: 2020-02-26
  Administered 2020-02-24 – 2020-02-25 (×2): 10 mg via ORAL
  Filled 2020-02-24 (×3): qty 1

## 2020-02-24 MED ORDER — LISINOPRIL 20 MG PO TABS
40.0000 mg | ORAL_TABLET | Freq: Every day | ORAL | Status: DC
Start: 2020-02-24 — End: 2020-02-26
  Administered 2020-02-24 – 2020-02-25 (×2): 40 mg via ORAL
  Filled 2020-02-24 (×2): qty 2

## 2020-02-24 MED ORDER — LEVOTHYROXINE SODIUM 75 MCG PO TABS
75.0000 ug | ORAL_TABLET | Freq: Every day | ORAL | Status: DC
  Administered 2020-02-25 – 2020-02-26 (×2): 75 ug via ORAL
  Filled 2020-02-24 (×2): qty 1

## 2020-02-24 MED ORDER — CLONAZEPAM 0.5 MG PO TABS
1.0000 mg | ORAL_TABLET | Freq: Three times a day (TID) | ORAL | Status: DC
  Administered 2020-02-24 – 2020-02-25 (×5): 1 mg via ORAL
  Filled 2020-02-24 (×5): qty 2

## 2020-02-24 MED ORDER — ONDANSETRON HCL 4 MG/2ML IJ SOLN: 4.0000 mg | Freq: Once | INTRAMUSCULAR | Status: DC | PRN

## 2020-02-24 MED ORDER — ACETAMINOPHEN 650 MG RE SUPP: 650.0000 mg | RECTAL | Status: DC | PRN

## 2020-02-24 MED ORDER — BISACODYL 5 MG PO TBEC: 5.0000 mg | DELAYED_RELEASE_TABLET | Freq: Every day | ORAL | Status: DC | PRN

## 2020-02-24 MED ORDER — SUCCINYLCHOLINE CHLORIDE 200 MG/10ML IV SOSY
PREFILLED_SYRINGE | INTRAVENOUS | Status: DC | PRN
  Administered 2020-02-24: 100 mg via INTRAVENOUS

## 2020-02-24 MED ORDER — ACETAMINOPHEN 325 MG PO TABS
650.0000 mg | ORAL_TABLET | ORAL | Status: DC | PRN
  Administered 2020-02-25 – 2020-02-26 (×3): 650 mg via ORAL
  Filled 2020-02-24 (×3): qty 2

## 2020-02-24 MED ORDER — SODIUM CHLORIDE 0.9 % IV SOLN
250.0000 mL | INTRAVENOUS | Status: DC
  Administered 2020-02-24: 250 mL via INTRAVENOUS

## 2020-02-24 MED ORDER — OXYCODONE HCL 5 MG PO TABS: 5.0000 mg | ORAL_TABLET | Freq: Once | ORAL | Status: AC | PRN

## 2020-02-24 MED ORDER — FENOFIBRATE 160 MG PO TABS
160.0000 mg | ORAL_TABLET | Freq: Every day | ORAL | Status: DC
  Administered 2020-02-25: 160 mg via ORAL
  Filled 2020-02-24: qty 1

## 2020-02-24 MED ORDER — PHENYLEPHRINE HCL-NACL 10-0.9 MG/250ML-% IV SOLN
INTRAVENOUS | Status: DC | PRN
  Administered 2020-02-24: 25 ug/min via INTRAVENOUS

## 2020-02-24 MED ORDER — MAGNESIUM OXIDE 400 (241.3 MG) MG PO TABS
400.0000 mg | ORAL_TABLET | Freq: Every day | ORAL | Status: DC
Start: 2020-02-24 — End: 2020-02-26
  Administered 2020-02-24 – 2020-02-25 (×2): 400 mg via ORAL
  Filled 2020-02-24 (×2): qty 1

## 2020-02-24 MED ORDER — LINAGLIPTIN 5 MG PO TABS
5.0000 mg | ORAL_TABLET | Freq: Every day | ORAL | Status: DC
Start: 2020-02-24 — End: 2020-02-26
  Administered 2020-02-24 – 2020-02-25 (×2): 5 mg via ORAL
  Filled 2020-02-24 (×3): qty 1

## 2020-02-24 MED ORDER — ORAL CARE MOUTH RINSE: 15.0000 mL | Freq: Once | OROMUCOSAL | Status: AC

## 2020-02-24 MED ORDER — PROPOFOL 500 MG/50ML IV EMUL
INTRAVENOUS | Status: DC | PRN
  Administered 2020-02-24: 50 ug/kg/min via INTRAVENOUS

## 2020-02-24 SURGICAL SUPPLY — 67 items
BATTALION LLIF ITRADISCAL SHIM (MISCELLANEOUS) ×3
BENZOIN TINCTURE PRP APPL 2/3 (GAUZE/BANDAGES/DRESSINGS) ×3 IMPLANT
BLADE CLIPPER SURG (BLADE) IMPLANT
BLADE SURG 10 STRL SS (BLADE) ×3 IMPLANT
CLIP SPRING STIM LLIF SAFEOP (CLIP) ×3 IMPLANT
CORD BIPOLAR FORCEPS 12FT (ELECTRODE) ×3 IMPLANT
COVER SURGICAL LIGHT HANDLE (MISCELLANEOUS) ×3 IMPLANT
COVER WAND RF STERILE (DRAPES) IMPLANT
DILATOR INSULATED LLIF 8-13-18 (NEUROSURGERY SUPPLIES) ×3 IMPLANT
DRAPE C-ARM 42X72 X-RAY (DRAPES) ×3 IMPLANT
DRAPE C-ARMOR (DRAPES) ×3 IMPLANT
DRAPE POUCH INSTRU U-SHP 10X18 (DRAPES) ×3 IMPLANT
DRAPE SURG 17X23 STRL (DRAPES) ×12 IMPLANT
DRAPE U-SHAPE 47X51 STRL (DRAPES) IMPLANT
DRSG MEPILEX BORDER 4X8 (GAUZE/BANDAGES/DRESSINGS) ×3 IMPLANT
DURAPREP 26ML APPLICATOR (WOUND CARE) ×3 IMPLANT
ELECT BLADE 4.0 EZ CLEAN MEGAD (MISCELLANEOUS) ×3
ELECT CAUTERY BLADE 6.4 (BLADE) ×3 IMPLANT
ELECT KIT SAFEOP EMG/NMJ (KITS) ×1
ELECT REM PT RETURN 9FT ADLT (ELECTROSURGICAL) ×3
ELECTRODE BLDE 4.0 EZ CLN MEGD (MISCELLANEOUS) ×2 IMPLANT
ELECTRODE REM PT RTRN 9FT ADLT (ELECTROSURGICAL) ×2 IMPLANT
GAUZE 4X4 16PLY RFD (DISPOSABLE) ×6 IMPLANT
GAUZE SPONGE 4X4 12PLY STRL (GAUZE/BANDAGES/DRESSINGS) ×3 IMPLANT
GLOVE BIO SURGEON STRL SZ7 (GLOVE) ×6 IMPLANT
GLOVE BIO SURGEON STRL SZ8 (GLOVE) ×3 IMPLANT
GLOVE BIOGEL PI IND STRL 7.0 (GLOVE) ×4 IMPLANT
GLOVE BIOGEL PI IND STRL 8 (GLOVE) ×2 IMPLANT
GLOVE BIOGEL PI INDICATOR 7.0 (GLOVE) ×2
GLOVE BIOGEL PI INDICATOR 8 (GLOVE) ×1
GOWN STRL REUS W/ TWL LRG LVL3 (GOWN DISPOSABLE) ×6 IMPLANT
GOWN STRL REUS W/TWL LRG LVL3 (GOWN DISPOSABLE) ×3
GUIDEWIRE LLIF TT 320 (WIRE) ×3 IMPLANT
KIT BASIN OR (CUSTOM PROCEDURE TRAY) ×3 IMPLANT
KIT EMG SRFC ELECT (KITS) ×2 IMPLANT
KIT TURNOVER KIT B (KITS) ×3 IMPLANT
KNIFE ANNULOTOMY (BLADE) ×3 IMPLANT
MIX DBX 10CC 35% BONE (Bone Implant) ×3 IMPLANT
NEEDLE HYPO 25GX1X1/2 BEV (NEEDLE) ×3 IMPLANT
NS IRRIG 1000ML POUR BTL (IV SOLUTION) ×3 IMPLANT
PACK LAMINECTOMY ORTHO (CUSTOM PROCEDURE TRAY) ×3 IMPLANT
PACK UNIVERSAL I (CUSTOM PROCEDURE TRAY) ×3 IMPLANT
PAD ARMBOARD 7.5X6 YLW CONV (MISCELLANEOUS) ×6 IMPLANT
PLATE LIF AMP 2/SCRW 08/15 (Plate) ×3 IMPLANT
PROBE BALL TIP LLIF SAFEOP (NEUROSURGERY SUPPLIES) ×3 IMPLANT
SCREW LIF AMP 5.5X55 (Screw) ×3 IMPLANT
SHIM ITRADISCAL BATTALION LLIF (MISCELLANEOUS) ×2 IMPLANT
SPACER LAT BATT 22X10X55 0D (Spacer) ×3 IMPLANT
SPONGE INTESTINAL PEANUT (DISPOSABLE) ×6 IMPLANT
SPONGE LAP 4X18 RFD (DISPOSABLE) ×3 IMPLANT
SPONGE SURGIFOAM ABS GEL 100 (HEMOSTASIS) IMPLANT
STRIP CLOSURE SKIN 1/2X4 (GAUZE/BANDAGES/DRESSINGS) ×6 IMPLANT
SURGIFLO W/THROMBIN 8M KIT (HEMOSTASIS) IMPLANT
SUT MNCRL AB 4-0 PS2 18 (SUTURE) ×3 IMPLANT
SUT PROLENE 5 0 C 1 24 (SUTURE) IMPLANT
SUT VIC AB 1 CT1 18XCR BRD 8 (SUTURE) ×2 IMPLANT
SUT VIC AB 1 CT1 27 (SUTURE) ×1
SUT VIC AB 1 CT1 27XBRD ANBCTR (SUTURE) ×2 IMPLANT
SUT VIC AB 1 CT1 8-18 (SUTURE) ×1
SUT VIC AB 2-0 CT2 18 VCP726D (SUTURE) ×3 IMPLANT
SYR BULB IRRIG 60ML STRL (SYRINGE) ×6 IMPLANT
TAPE CLOTH SURG 6X10 WHT LF (GAUZE/BANDAGES/DRESSINGS) ×6 IMPLANT
TOWEL GREEN STERILE (TOWEL DISPOSABLE) ×3 IMPLANT
TOWEL GREEN STERILE FF (TOWEL DISPOSABLE) ×3 IMPLANT
TRAY FOLEY MTR SLVR 16FR STAT (SET/KITS/TRAYS/PACK) ×3 IMPLANT
WATER STERILE IRR 1000ML POUR (IV SOLUTION) ×3 IMPLANT
YANKAUER SUCT BULB TIP NO VENT (SUCTIONS) ×3 IMPLANT

## 2020-02-24 NOTE — Anesthesia Preprocedure Evaluation (Addendum)
Anesthesia Evaluation  Patient identified by MRN, date of birth, ID band Patient awake    Reviewed: Allergy & Precautions, NPO status , Patient's Chart, lab work & pertinent test results  History of Anesthesia Complications Negative for: history of anesthetic complications  Airway Mallampati: II  TM Distance: >3 FB Neck ROM: Full    Dental  (+) Lower Dentures, Upper Dentures   Pulmonary former smoker,    Pulmonary exam normal        Cardiovascular hypertension, Pt. on medications and Pt. on home beta blockers + Peripheral Vascular Disease  Normal cardiovascular exam     Neuro/Psych PSYCHIATRIC DISORDERS Anxiety negative neurological ROS     GI/Hepatic Neg liver ROS, GERD  Controlled,  Endo/Other  diabetes, Type 2, Oral Hypoglycemic AgentsHypothyroidism   Renal/GU negative Renal ROS     Musculoskeletal  (+) Arthritis  (on narcotics),   Abdominal   Peds  Hematology  (+) anemia ,  On plavix    Anesthesia Other Findings Covid test negative   Reproductive/Obstetrics                            Anesthesia Physical Anesthesia Plan  ASA: III  Anesthesia Plan: General   Post-op Pain Management:    Induction: Intravenous  PONV Risk Score and Plan: 3 and Treatment may vary due to age or medical condition, Ondansetron and Propofol infusion  Airway Management Planned: Oral ETT  Additional Equipment: None  Intra-op Plan:   Post-operative Plan: Extubation in OR  Informed Consent: I have reviewed the patients History and Physical, chart, labs and discussed the procedure including the risks, benefits and alternatives for the proposed anesthesia with the patient or authorized representative who has indicated his/her understanding and acceptance.     Dental advisory given  Plan Discussed with: CRNA and Anesthesiologist  Anesthesia Plan Comments:        Anesthesia Quick Evaluation                                   Anesthesia Evaluation  Patient identified by MRN, date of birth, ID band Patient awake    Reviewed: Allergy & Precautions, NPO status , Patient's Chart, lab work & pertinent test results  Airway Mallampati: I  TM Distance: >3 FB Neck ROM: Full    Dental no notable dental hx. (+) Upper Dentures, Lower Dentures   Pulmonary former smoker,    Pulmonary exam normal breath sounds clear to auscultation       Cardiovascular Exercise Tolerance: Good hypertension, Pt. on medications + CAD and + Peripheral Vascular Disease  Normal cardiovascular exam Rhythm:Regular Rate:Normal  NL EF   Neuro/Psych Anxiety  Neuromuscular disease    GI/Hepatic Neg liver ROS, GERD  ,  Endo/Other  diabetes, Type 2Hypothyroidism   Renal/GU Renal InsufficiencyRenal diseaseCr 2.60 K+ 4.5     Musculoskeletal  (+) Arthritis ,   Abdominal   Peds  Hematology  (+) anemia , Hgb 10.7   Anesthesia Other Findings   Reproductive/Obstetrics                           Anesthesia Physical Anesthesia Plan  ASA: III  Anesthesia Plan: General   Post-op Pain Management:    Induction: Intravenous  PONV Risk Score and Plan: 3 and Treatment may vary due to age or medical condition, Ondansetron, Dexamethasone,  TIVA and Midazolam  Airway Management Planned: Oral ETT  Additional Equipment: None and Arterial line  Intra-op Plan:   Post-operative Plan: Extubation in OR  Informed Consent: I have reviewed the patients History and Physical, chart, labs and discussed the procedure including the risks, benefits and alternatives for the proposed anesthesia with the patient or authorized representative who has indicated his/her understanding and acceptance.     Dental advisory given and Interpreter used for interveiw  Plan Discussed with: CRNA and Anesthesiologist  Anesthesia Plan Comments: (PAT note written 02/18/2020 by Myra Gianotti,  PA-C.  2 large IV + Aline)    Anesthesia Quick Evaluation

## 2020-02-24 NOTE — Anesthesia Procedure Notes (Signed)
Arterial Line Insertion Start/End12/29/2021 7:10 AM, 02/24/2020 7:15 AM Performed by: Janene Harvey, CRNA, CRNA  Preanesthetic checklist: patient identified, IV checked, site marked, risks and benefits discussed, surgical consent, monitors and equipment checked, pre-op evaluation, timeout performed and anesthesia consent Lidocaine 1% used for infiltration Right, radial was placed Catheter size: 20 G Hand hygiene performed  and maximum sterile barriers used  Allen's test indicative of satisfactory collateral circulation Attempts: 1 Procedure performed without using ultrasound guided technique. Following insertion, dressing applied and Biopatch. Post procedure assessment: normal  Patient tolerated the procedure well with no immediate complications.

## 2020-02-24 NOTE — Progress Notes (Signed)
Received call from Posey Pronto, MD.  No evidence of retained instruments on xray. MD notified.

## 2020-02-24 NOTE — H&P (Signed)
PREOPERATIVE H&P  Chief Complaint: Bilateral leg pain  HPI: Adam Jones is a 74 y.o. 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 Anemia   . Anxiety   . BPH (benign prostatic hyperplasia)   . Carotid artery stenosis   . Cataracts, bilateral   . Chronic kidney disease   . Diabetes mellitus without complication ([place])   . GERD (gastroesophageal reflux disease)   . History of kidney stones   . Hypercholesteremia   . Hypertension   . Hypogonadism in male   . Hypothyroidism   . Low back pain   . Lumbar radiculopathy   . Osteoarthritis   . Peripheral vascular disease ([place])   . Pneumonia   . RLS (restless legs syndrome)    Past Surgical History:  Procedure Laterality Date  . BACK SURGERY     x4  . EXTRACORPOREAL SHOCK WAVE LITHOTRIPSY    . KIDNEY STONE SURGERY    . PENILE PROSTHESIS IMPLANT     Social History   Socioeconomic History  . Marital status: Unknown    Spouse name: Not on file  . Number of children: Not on file  . Years of education: Not on file  . Highest education level: Not on file  Occupational History  . Not on file  Tobacco Use  . Smoking status: Former Research scientist (life sciences)  . Smokeless tobacco: Never Used  . Tobacco comment: 02/17/20 - "haven't smoked in 24 years"  Vaping Use  . Vaping Use: Never used  Substance and Sexual Activity  . Alcohol use: Not Currently  . Drug use: Yes    Types: Benzodiazepines  . Sexual activity: Not on file  Other Topics Concern  . Not on file  Social History Narrative  . Not on file   Social Determinants of Health   Financial Resource Strain: Not on file  Food Insecurity: Not on file  Transportation  Needs: Not on file  Physical Activity: Not on file  Stress: Not on file  Social Connections: Not on file   History reviewed. No pertinent family history. Allergies  Allergen Reactions  . Levothyroxine     Kept him awake   . Metformin Other (See Comments)    Lactic acidosis  . Venlafaxine Nausea Only  . Zolpidem Diarrhea  . Diclofenac Sodium Nausea And Vomiting  . Hydrocodone Itching       . Iodinated Diagnostic Agents Hives and Rash  . Iodine Rash    Betadine is ok no reaction to that   Prior to Admission medications   Medication Sig Start Date End Date Taking? Authorizing Provider  acetaminophen (TYLENOL) 500 MG tablet Take 1,000 mg by mouth in the morning and at bedtime.   Yes [provider]  allopurinol (ZYLOPRIM) 100 MG tablet Take 100 mg by mouth daily.   Yes [provider]  amLODipine (NORVASC) 10 MG tablet Take 10 mg by mouth daily.   Yes [provider]  APPLE CIDER VINEGAR PO Take 450 mg by mouth 3 (three) times daily.   Yes [provider]  aspirin EC 81 MG tablet Take 81 mg by mouth daily in the afternoon. Swallow whole.   Yes [provider]  atorvastatin (LIPITOR) 80 MG tablet Take 80 mg by mouth daily in the afternoon.   Yes [provider]  carvedilol (COREG) 25 MG tablet Take 25 mg by mouth 2 (two) times daily with a meal.   Yes [provider]  clonazePAM (KLONOPIN) 1 MG tablet Take 1 mg by mouth 3 (three) times daily.   Yes [provider]  diphenhydrAMINE (BENADRYL) 25 MG tablet Take 25 mg by mouth daily as needed for allergies.   Yes [provider]  docusate sodium (COLACE) 100 MG capsule Take 300 mg by mouth daily.   Yes [provider]  doxazosin (CARDURA) 4 MG tablet Take 4 mg by mouth 2 (two) times daily.   Yes [provider]  famotidine (PEPCID) 20 MG tablet Take 40 mg by mouth daily.   Yes [provider]  fenofibrate 160 MG tablet Take 160 mg  by mouth daily.   Yes [provider]  fentaNYL (DURAGESIC) 12 MCG/HR Place 1 patch onto the skin every other day. 02/07/20  Yes [provider]  ferrous sulfate 325 (65 FE) MG tablet Take 325 mg by mouth daily.   Yes [provider]  FIBER PO Take 3 capsules by mouth daily.   Yes [provider]  glipiZIDE (GLUCOTROL XL) 10 MG 24 hr tablet Take 10 mg by mouth daily.   Yes [provider]  hydrALAZINE (APRESOLINE) 25 MG tablet Take 25 mg by mouth 3 (three) times daily.   Yes [provider]  levothyroxine (SYNTHROID) 75 MCG tablet Take 75 mcg by mouth daily before breakfast.   Yes [provider]  linagliptin (TRADJENTA) 5 MG TABS tablet Take 5 mg by mouth daily in the afternoon.   Yes [provider]  lisinopril (ZESTRIL) 40 MG tablet Take 40 mg by mouth daily.   Yes [provider]  Magnesium 400 MG TABS Take 400 mg by mouth daily.   Yes [provider]  Melatonin 5 MG CAPS Take 5 mg by mouth at bedtime.   Yes [provider]  Multiple Vitamin (MULTIVITAMIN WITH MINERALS) TABS tablet Take 1 tablet by mouth daily.   Yes [provider]  naloxone (NARCAN) 4 MG/0.1ML LIQD nasal spray kit Place 1 spray into the nose as needed (opioid overdose).   Yes [provider]  Omega-3 Fatty Acids (FISH OIL) 1000 MG CAPS Take 1,000 mg by mouth daily.   Yes [provider]  Oxycodone HCl 10 MG TABS Take 10 mg by mouth every 4 (four) hours as needed for pain. 01/28/20  Yes [provider]  Potassium Citrate 15 MEQ (1620 MG) TBCR Take 1 tablet by mouth daily.   Yes [provider]  Testosterone 1.62 % GEL Place 3 Pump onto the skin every other day.   Yes [provider]  traZODone (DESYREL) 50 MG tablet Take 100 mg by mouth at bedtime.   Yes [provider]  TURMERIC PO Take 1 tablet by mouth daily.   Yes [provider]  clopidogrel (PLAVIX) 75  MG tablet Take 75 mg by mouth daily in the afternoon.    [provider]  fentaNYL (DURAGESIC) 25 MCG/HR Place 1 patch onto the skin every other day.    [provider]     All other systems have been reviewed and were otherwise negative with the exception of those mentioned in the HPI and as above.  Physical Exam: Vitals:   02/24/20 0613  BP: Marland Kitchen)  157/56  Pulse: 87  Resp: 18  Temp: 98.1 F (36.7 C)  SpO2: 97%    Body mass index is 29.8 kg/m.  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

## 2020-02-24 NOTE — Transfer of Care (Signed)
Immediate Anesthesia Transfer of Care Note  Patient: Adam Jones  Procedure(s) Performed: LEFT LATERAL LUMBAR 3-LUMBAR 4 INTERBODY FUSION WITH INSTRUMENTATION AND ALLOGRAFT (Left Spine Lumbar)  Patient Location: PACU  Anesthesia Type:General  Level of Consciousness: awake, alert , oriented, patient cooperative and responds to stimulation  Airway & Oxygen Therapy: Patient Spontanous Breathing and Patient connected to face mask oxygen  Post-op Assessment: Report given to RN and Post -op Vital signs reviewed and stable  Post vital signs: Reviewed and stable  Last Vitals:  Vitals Value Taken Time  BP 126/74 02/24/20 1034  Temp    Pulse 60 02/24/20 1035  Resp 19 02/24/20 1035  SpO2 99 % 02/24/20 1035  Vitals shown include unvalidated device data.  Last Pain:  Vitals:   02/24/20 0613  TempSrc: Oral  PainSc:       Patients Stated Pain Goal: 4 (88/64/84 7207)  Complications: No complications documented.

## 2020-02-24 NOTE — Anesthesia Postprocedure Evaluation (Signed)
Anesthesia Post Note  Patient: Adam Jones  Procedure(s) Performed: LEFT LATERAL LUMBAR 3-LUMBAR 4 INTERBODY FUSION WITH INSTRUMENTATION AND ALLOGRAFT (Left Spine Lumbar)     Patient location during evaluation: PACU Anesthesia Type: General Level of consciousness: awake and alert Pain management: pain level controlled Vital Signs Assessment: post-procedure vital signs reviewed and stable Respiratory status: spontaneous breathing, nonlabored ventilation, respiratory function stable and patient connected to nasal cannula oxygen Cardiovascular status: blood pressure returned to baseline and stable Postop Assessment: no apparent nausea or vomiting Anesthetic complications: no   No complications documented.  Last Vitals:  Vitals:   02/24/20 1235 02/24/20 1309  BP: (!) 147/64 (!) 143/73  Pulse: 65 81  Resp: 13 18  Temp: (!) 36.3 C   SpO2: 97% 97%    Last Pain:  Vitals:   02/24/20 1235  TempSrc:   PainSc: 7                  Barnet Glasgow

## 2020-02-24 NOTE — Op Note (Signed)
PATIENT NAME: Adam Jones RECORD NO.:   [id number]    DATE OF BIRTH: 05-28-1945   DATE OF PROCEDURE: 02/24/2020                               OPERATIVE REPORT   PREOPERATIVE DIAGNOSES: 1.  Bilateral lumbar radiculopathy and stenosis at L3/4 2.  S/p previous L4-S1 fusion   POSTOPERATIVE DIAGNOSES: 1.  Bilateral lumbar radiculopathy and stenosis at L3/4 2.  S/p previous L4-S1 fusion   PROCEDURE (Stage 1 of 2):  1.  Left-sided lateral interbody fusion, L3/4  via direct lateral retroperitoneal approach 2.  Insertion of interbody device x 1 (10 mm x 22 mm x 55 mm Alphatec intervertebral spacer)  3.  Placement of anterior instrumentation, L3/4 4.  Use of morselized allograft -- DBX mix 5.  Intraoperative use of fluoroscopy.   SURGEON:  Phylliss Bob, MD   ASSISTANT:  None   ANESTHESIA:  General endotracheal anesthesia.   COMPLICATIONS:  None.   DISPOSITION:  Stable.   ESTIMATED BLOOD LOSS:  Minimal.   INDICATIONS:  Briefly, the patient is a pleasant 74 year old male who did present to me with ongoing bilateral leg pain.  He is noted to be status post a previous fusion spanning L4-S1.  A CAT scan did confirm a successful fusion from L4-S1. His MRI did reveal severe stenosis of the L3-4 level, which I did feel was correlating to his symptoms. Given his ongoing pain and dysfunction, we did discuss proceeding with the procedure reflected above. The patient did wish to proceed, after a full understanding of the risks and benefits of surgery.   DESCRIPTION OF PROCEDURE:  On 02/24/2020, the patient was brought to surgery and general endotracheal anesthesia was administered.  The patient was placed in the lateral decubitus position, with the left side up.  Neurologic monitoring leads were placed by the monitoring technician.  The patient's torso and lower extremities were secured to the bed. The patient's hips and knees were flexed in order to lessen the tension on the psoas  musculature.  The left flank was then prepped and draped in the usual sterile fashion.  The bed was slightly flexed, in order to optimize exposure to the L3/4 intervertebral space.  After a timeout procedure was performed, a left-sided transverse incision was made over the left flank overlying the L3/4 intervertebral space.  The retroperitoneal space was encountered, after dissection through the oblique musculature.  The peritoneum was bluntly swept anteriorly, and the psoas was readily identified.  I did use a series of dilators to dock over the L3/4 intervertebral space.  I did use neurologic monitoring while placing the dilators, in order to ensure that there were no neurologic structures in the immediate vicinity of the dilators.  The lumbar plexus was noted to be posterior.  A self-retaining retractor was placed, and was attached to a rigid arm.  The retractor was very gently dilated and a shim was placed into the L3/4 intervertebral space.  I then used a knife to perform an annulotomy at the lateral aspect of the L3/4 intervertebral space.  I then used a series of curettes and pituitary rongeurs in order to perform a thorough and complete L3/4 intervertebral diskectomy.  The contralateral annulus was released.  I then placed a series of intervertebral spacer trials, and I did feel that a 10 x 22 mm x 55 mm spacer would be the most appropriate  fit.  The appropriate spacer was then packed with DBX mix and tamped into position.  I was very pleased with the final resting position of the intervertebral spacer.  At this point, a size 8 plate was placed over the lateral aspect of the L3 and L4 vertebral bodies.  I then used an awl to prepare the trajectory of the L3 and L4 vertebral body screws.  A 55 mm screw was placed into the L3 vertebral body, and a 55 mm screw was placed into the L4 vertebral body.  The screws were locked to the plate.  I was very pleased with the final AP and lateral fluoroscopic images.  At  this point, the wound was copiously irrigated.  The fascia, internal, and external oblique musculature was closed using #1 Vicryl.  The subcutaneous layer was closed using 2-0 Vicryl and the skin was closed using 4-0 Monocryl.    Of note, I did use neurologic monitoring throughout the entire surgery, and there was no abnormal EMG activity noted throughout the entire surgery. All instrument counts were correct at the termination of the procedure.    Phylliss Bob, MD

## 2020-02-24 NOTE — Anesthesia Procedure Notes (Signed)
Procedure Name: Intubation Date/Time: 02/24/2020 7:41 AM Performed by: Glynda Jaeger, CRNA Pre-anesthesia Checklist: Patient identified, Patient being monitored, Timeout performed, Emergency Drugs available and Suction available Patient Re-evaluated:Patient Re-evaluated prior to induction Oxygen Delivery Method: Circle System Utilized Preoxygenation: Pre-oxygenation with 100% oxygen Induction Type: IV induction Ventilation: Mask ventilation without difficulty Laryngoscope Size: Mac and 4 Grade View: Grade I Tube type: Oral Tube size: 7.5 mm Number of attempts: 1 Airway Equipment and Method: Stylet Placement Confirmation: ETT inserted through vocal cords under direct vision,  positive ETCO2 and breath sounds checked- equal and bilateral Secured at: 22 cm Tube secured with: Tape Dental Injury: Teeth and Oropharynx as per pre-operative assessment

## 2020-02-25 ENCOUNTER — Inpatient Hospital Stay (HOSPITAL_COMMUNITY): Payer: Medicare HMO | Admitting: Anesthesiology

## 2020-02-25 ENCOUNTER — Inpatient Hospital Stay: Payer: Self-pay

## 2020-02-25 ENCOUNTER — Inpatient Hospital Stay (HOSPITAL_COMMUNITY): Payer: Medicare HMO

## 2020-02-25 ENCOUNTER — Inpatient Hospital Stay (HOSPITAL_COMMUNITY): Admission: RE | Admit: 2020-02-25 | Payer: Medicare HMO | Source: Home / Self Care | Admitting: Orthopedic Surgery

## 2020-02-25 ENCOUNTER — Inpatient Hospital Stay (HOSPITAL_COMMUNITY)
Admission: RE | Disposition: A | Discharge status: Home / Self Care | Payer: Self-pay | Source: Home / Self Care | Attending: Orthopedic Surgery

## 2020-02-25 LAB — CBC WITH DIFFERENTIAL/PLATELET
Abs Immature Granulocytes: 0.03 10*3/uL (ref 0.00–0.07)
Basophils Absolute: 0 10*3/uL (ref 0.0–0.1)
Basophils Relative: 0 %
Eosinophils Absolute: 0.1 10*3/uL (ref 0.0–0.5)
Eosinophils Relative: 1 %
HCT: 32.4 % — ABNORMAL LOW (ref 39.0–52.0)
Hemoglobin: 10.1 g/dL — ABNORMAL LOW (ref 13.0–17.0)
Immature Granulocytes: 1 %
Lymphocytes Relative: 25 %
Lymphs Abs: 1.2 10*3/uL (ref 0.7–4.0)
MCH: 28.6 pg (ref 26.0–34.0)
MCHC: 31.2 g/dL (ref 30.0–36.0)
MCV: 91.8 fL (ref 80.0–100.0)
Monocytes Absolute: 0.4 10*3/uL (ref 0.1–1.0)
Monocytes Relative: 8 %
Neutro Abs: 3 10*3/uL (ref 1.7–7.7)
Neutrophils Relative %: 65 %
Platelets: 173 10*3/uL (ref 150–400)
RBC: 3.53 MIL/uL — ABNORMAL LOW (ref 4.22–5.81)
RDW: 14.8 % (ref 11.5–15.5)
WBC: 4.7 10*3/uL (ref 4.0–10.5)
nRBC: 0 % (ref 0.0–0.2)

## 2020-02-25 LAB — COMPREHENSIVE METABOLIC PANEL
ALT: 29 U/L (ref 0–44)
AST: 43 U/L — ABNORMAL HIGH (ref 15–41)
Albumin: 3.1 g/dL — ABNORMAL LOW (ref 3.5–5.0)
Alkaline Phosphatase: 41 U/L (ref 38–126)
Anion gap: 8 (ref 5–15)
BUN: 28 mg/dL — ABNORMAL HIGH (ref 8–23)
CO2: 28 mmol/L (ref 22–32)
Calcium: 8.1 mg/dL — ABNORMAL LOW (ref 8.9–10.3)
Chloride: 105 mmol/L (ref 98–111)
Creatinine, Ser: 2.39 mg/dL — ABNORMAL HIGH (ref 0.61–1.24)
GFR, Estimated: 28 mL/min — ABNORMAL LOW (ref >60)
Glucose, Bld: 94 mg/dL (ref 70–99)
Potassium: 3.6 mmol/L (ref 3.5–5.1)
Sodium: 141 mmol/L (ref 135–145)
Total Bilirubin: 0.2 mg/dL — ABNORMAL LOW (ref 0.3–1.2)
Total Protein: 5.3 g/dL — ABNORMAL LOW (ref 6.5–8.1)

## 2020-02-25 LAB — GLUCOSE, CAPILLARY
Glucose-Capillary: 123 mg/dL — ABNORMAL HIGH (ref 70–99)
Glucose-Capillary: 60 mg/dL — ABNORMAL LOW (ref 70–99)
Glucose-Capillary: 169 mg/dL — ABNORMAL HIGH (ref 70–99)
Glucose-Capillary: 167 mg/dL — ABNORMAL HIGH (ref 70–99)
Glucose-Capillary: 150 mg/dL — ABNORMAL HIGH (ref 70–99)

## 2020-02-25 LAB — APTT: aPTT: 32 seconds (ref 24–36)

## 2020-02-25 LAB — PROTIME-INR
INR: 1.2 (ref 0.8–1.2)
Prothrombin Time: 14.4 seconds (ref 11.4–15.2)

## 2020-02-25 SURGERY — POSTERIOR LUMBAR FUSION 1 LEVEL
Anesthesia: General | Site: Spine Lumbar

## 2020-02-25 MED ORDER — 0.9 % SODIUM CHLORIDE (POUR BTL) OPTIME
TOPICAL | Status: DC | PRN
  Administered 2020-02-25 (×2): 1000 mL

## 2020-02-25 MED ORDER — FENTANYL CITRATE (PF) 250 MCG/5ML IJ SOLN
INTRAMUSCULAR | Status: DC | PRN
  Administered 2020-02-25 (×3): 50 ug via INTRAVENOUS

## 2020-02-25 MED ORDER — PHENYLEPHRINE 40 MCG/ML (10ML) SYRINGE FOR IV PUSH (FOR BLOOD PRESSURE SUPPORT)
PREFILLED_SYRINGE | INTRAVENOUS | Status: DC | PRN
  Administered 2020-02-25 (×2): 80 ug via INTRAVENOUS

## 2020-02-25 MED ORDER — BUPIVACAINE-EPINEPHRINE 0.25% -1:200000 IJ SOLN
INTRAMUSCULAR | Status: DC | PRN
  Administered 2020-02-25: 20 mL
  Administered 2020-02-25: 10 mL

## 2020-02-25 MED ORDER — THROMBIN 20000 UNITS EX SOLR
CUTANEOUS | Status: DC | PRN
  Administered 2020-02-25: 20000 [IU] via TOPICAL

## 2020-02-25 MED ORDER — FENTANYL CITRATE (PF) 100 MCG/2ML IJ SOLN: 25.0000 ug | INTRAMUSCULAR | Status: DC | PRN

## 2020-02-25 MED ORDER — LIDOCAINE 2% (20 MG/ML) 5 ML SYRINGE
INTRAMUSCULAR | Status: DC | PRN
  Administered 2020-02-25: 60 mg via INTRAVENOUS

## 2020-02-25 MED ORDER — POVIDONE-IODINE 10 % EX SOLN
Freq: Once | CUTANEOUS | Status: AC
  Filled 2020-02-25: qty 118

## 2020-02-25 MED ORDER — OXYCODONE HCL 5 MG/5ML PO SOLN: 5.0000 mg | Freq: Once | ORAL | Status: DC | PRN

## 2020-02-25 MED ORDER — PROPOFOL 10 MG/ML IV BOLUS
INTRAVENOUS | Status: AC
  Filled 2020-02-25: qty 20

## 2020-02-25 MED ORDER — CEFAZOLIN SODIUM-DEXTROSE 2-3 GM-%(50ML) IV SOLR
INTRAVENOUS | Status: DC | PRN
  Administered 2020-02-25: 2 g via INTRAVENOUS

## 2020-02-25 MED ORDER — CEFAZOLIN SODIUM-DEXTROSE 2-4 GM/100ML-% IV SOLN
INTRAVENOUS | Status: AC
  Filled 2020-02-25: qty 100

## 2020-02-25 MED ORDER — LIDOCAINE 2% (20 MG/ML) 5 ML SYRINGE
INTRAMUSCULAR | Status: AC
  Filled 2020-02-25: qty 5

## 2020-02-25 MED ORDER — ROCURONIUM BROMIDE 10 MG/ML (PF) SYRINGE
PREFILLED_SYRINGE | INTRAVENOUS | Status: DC | PRN
  Administered 2020-02-25: 40 mg via INTRAVENOUS
  Administered 2020-02-25: 60 mg via INTRAVENOUS

## 2020-02-25 MED ORDER — PROPOFOL 10 MG/ML IV BOLUS
INTRAVENOUS | Status: AC
  Filled 2020-02-25: qty 40

## 2020-02-25 MED ORDER — SUGAMMADEX SODIUM 200 MG/2ML IV SOLN
INTRAVENOUS | Status: DC | PRN
  Administered 2020-02-25: 50 mg via INTRAVENOUS
  Administered 2020-02-25: 150 mg via INTRAVENOUS
  Administered 2020-02-25 (×3): 50 mg via INTRAVENOUS

## 2020-02-25 MED ORDER — FENTANYL CITRATE (PF) 250 MCG/5ML IJ SOLN
INTRAMUSCULAR | Status: AC
  Filled 2020-02-25: qty 5

## 2020-02-25 MED ORDER — PROPOFOL 10 MG/ML IV BOLUS
INTRAVENOUS | Status: DC | PRN
Start: 2020-02-25 — End: 2020-02-25
  Administered 2020-02-25: 150 mg via INTRAVENOUS

## 2020-02-25 MED ORDER — FENTANYL CITRATE (PF) 100 MCG/2ML IJ SOLN
25.0000 ug | INTRAMUSCULAR | Status: DC | PRN
  Administered 2020-02-25: 25 ug via INTRAVENOUS

## 2020-02-25 MED ORDER — CEFAZOLIN SODIUM-DEXTROSE 2-4 GM/100ML-% IV SOLN: 2.0000 g | INTRAVENOUS | Status: DC

## 2020-02-25 MED ORDER — DEXTROSE 50 % IV SOLN
1.0000 | Freq: Once | INTRAVENOUS | Status: AC
  Administered 2020-02-25: 50 mL via INTRAVENOUS

## 2020-02-25 MED ORDER — DEXTROSE 50 % IV SOLN
INTRAVENOUS | Status: AC
  Filled 2020-02-25: qty 50

## 2020-02-25 MED ORDER — PHENYLEPHRINE HCL-NACL 10-0.9 MG/250ML-% IV SOLN
INTRAVENOUS | Status: DC | PRN
  Administered 2020-02-25: 25 ug/min via INTRAVENOUS

## 2020-02-25 MED ORDER — OXYCODONE HCL 5 MG PO TABS: 5.0000 mg | ORAL_TABLET | Freq: Once | ORAL | Status: DC | PRN

## 2020-02-25 MED ORDER — ONDANSETRON HCL 4 MG/2ML IJ SOLN
INTRAMUSCULAR | Status: DC | PRN
  Administered 2020-02-25: 4 mg via INTRAVENOUS

## 2020-02-25 MED ORDER — ONDANSETRON HCL 4 MG/2ML IJ SOLN: 4.0000 mg | Freq: Once | INTRAMUSCULAR | Status: DC | PRN

## 2020-02-25 MED ORDER — PROPOFOL 500 MG/50ML IV EMUL
INTRAVENOUS | Status: DC | PRN
  Administered 2020-02-25: 35 ug/kg/min via INTRAVENOUS

## 2020-02-25 MED ORDER — ACETAMINOPHEN 10 MG/ML IV SOLN
INTRAVENOUS | Status: DC | PRN
  Administered 2020-02-25: 1000 mg via INTRAVENOUS

## 2020-02-25 MED ORDER — BUPIVACAINE LIPOSOME 1.3 % IJ SUSP
20.0000 mL | INTRAMUSCULAR | Status: DC
  Filled 2020-02-25: qty 20

## 2020-02-25 MED ORDER — LACTATED RINGERS IV SOLN: INTRAVENOUS | Status: DC | PRN

## 2020-02-25 MED FILL — Thrombin For Soln Kit 20000 Unit: CUTANEOUS | Qty: 1 | Status: AC

## 2020-02-25 SURGICAL SUPPLY — 83 items
BENZOIN TINCTURE PRP APPL 2/3 (GAUZE/BANDAGES/DRESSINGS) ×2 IMPLANT
BLADE CLIPPER SURG (BLADE) ×2 IMPLANT
BUR PRESCISION 1.7 ELITE (BURR) ×2 IMPLANT
BUR ROUND FLUTED 5 RND (BURR) ×2 IMPLANT
BUR ROUND PRECISION 4.0 (BURR) ×2 IMPLANT
BUR SABER RD CUTTING 3.0 (BURR) IMPLANT
CARTRIDGE OIL MAESTRO DRILL (MISCELLANEOUS) ×1 IMPLANT
CLSR STERI-STRIP ANTIMIC 1/2X4 (GAUZE/BANDAGES/DRESSINGS) ×2 IMPLANT
CNTNR URN SCR LID CUP LEK RST (MISCELLANEOUS) ×1 IMPLANT
CONNECTOR EXPEDIUM TI 55MM (Connector) ×4 IMPLANT
CONT SPEC 4OZ STRL OR WHT (MISCELLANEOUS) ×1
COVER MAYO STAND STRL (DRAPES) ×4 IMPLANT
COVER SURGICAL LIGHT HANDLE (MISCELLANEOUS) ×2 IMPLANT
COVER WAND RF STERILE (DRAPES) ×2 IMPLANT
DIFFUSER DRILL AIR PNEUMATIC (MISCELLANEOUS) ×2 IMPLANT
DRAIN CHANNEL 15F RND FF W/TCR (WOUND CARE) IMPLANT
DRAPE C-ARM 42X72 X-RAY (DRAPES) ×2 IMPLANT
DRAPE C-ARMOR (DRAPES) ×2 IMPLANT
DRAPE POUCH INSTRU U-SHP 10X18 (DRAPES) ×2 IMPLANT
DRAPE SURG 17X23 STRL (DRAPES) ×8 IMPLANT
DURAPREP 26ML APPLICATOR (WOUND CARE) ×2 IMPLANT
ELECT BLADE 4.0 EZ CLEAN MEGAD (MISCELLANEOUS) ×2
ELECT CAUTERY BLADE 6.4 (BLADE) ×2 IMPLANT
ELECT REM PT RETURN 9FT ADLT (ELECTROSURGICAL) ×2
ELECTRODE BLDE 4.0 EZ CLN MEGD (MISCELLANEOUS) ×1 IMPLANT
ELECTRODE REM PT RTRN 9FT ADLT (ELECTROSURGICAL) ×1 IMPLANT
EVACUATOR SILICONE 100CC (DRAIN) IMPLANT
FILTER STRAW FLUID ASPIR (MISCELLANEOUS) ×2 IMPLANT
GAUZE 4X4 16PLY RFD (DISPOSABLE) ×2 IMPLANT
GAUZE SPONGE 4X4 12PLY STRL (GAUZE/BANDAGES/DRESSINGS) ×2 IMPLANT
GAUZE SPONGE 4X4 12PLY STRL LF (GAUZE/BANDAGES/DRESSINGS) ×2 IMPLANT
GLOVE BIO SURGEON STRL SZ7 (GLOVE) ×6 IMPLANT
GLOVE BIO SURGEON STRL SZ8 (GLOVE) ×2 IMPLANT
GLOVE BIOGEL PI IND STRL 7.0 (GLOVE) ×3 IMPLANT
GLOVE BIOGEL PI IND STRL 8 (GLOVE) ×1 IMPLANT
GLOVE BIOGEL PI INDICATOR 7.0 (GLOVE) ×3
GLOVE BIOGEL PI INDICATOR 8 (GLOVE) ×1
GOWN STRL REUS W/ TWL LRG LVL3 (GOWN DISPOSABLE) ×2 IMPLANT
GOWN STRL REUS W/ TWL XL LVL3 (GOWN DISPOSABLE) ×1 IMPLANT
GOWN STRL REUS W/TWL LRG LVL3 (GOWN DISPOSABLE) ×2
GOWN STRL REUS W/TWL XL LVL3 (GOWN DISPOSABLE) ×1
IV CATH 14GX2 1/4 (CATHETERS) ×2 IMPLANT
KIT BASIN OR (CUSTOM PROCEDURE TRAY) ×2 IMPLANT
KIT POSITION SURG JACKSON T1 (MISCELLANEOUS) ×2 IMPLANT
KIT TURNOVER KIT B (KITS) ×2 IMPLANT
MARKER SKIN DUAL TIP RULER LAB (MISCELLANEOUS) ×4 IMPLANT
NEEDLE 18GX1X1/2 (RX/OR ONLY) (NEEDLE) ×4 IMPLANT
NEEDLE 22X1 1/2 (OR ONLY) (NEEDLE) ×4 IMPLANT
NEEDLE HYPO 25GX1X1/2 BEV (NEEDLE) ×2 IMPLANT
NEEDLE SPNL 18GX3.5 QUINCKE PK (NEEDLE) ×4 IMPLANT
NS IRRIG 1000ML POUR BTL (IV SOLUTION) ×2 IMPLANT
OIL CARTRIDGE MAESTRO DRILL (MISCELLANEOUS) ×2
PACK LAMINECTOMY ORTHO (CUSTOM PROCEDURE TRAY) ×2 IMPLANT
PACK UNIVERSAL I (CUSTOM PROCEDURE TRAY) ×2 IMPLANT
PAD ARMBOARD 7.5X6 YLW CONV (MISCELLANEOUS) ×4 IMPLANT
PATTIES SURGICAL .5 X1 (DISPOSABLE) ×2 IMPLANT
PATTIES SURGICAL .5X1.5 (GAUZE/BANDAGES/DRESSINGS) ×2 IMPLANT
PUTTY DBX 2.5CC (Putty) ×2 IMPLANT
PUTTY DBX 2.5CC DEPUY (Putty) ×1 IMPLANT
ROD EXPEDIUM PRE BENT 55MM (Rod) ×4 IMPLANT
SCREW SET SINGLE INNER (Screw) ×4 IMPLANT
SCREW VIPER CORT FIX 6X35 (Screw) ×4 IMPLANT
SPONGE INTESTINAL PEANUT (DISPOSABLE) ×2 IMPLANT
SPONGE SURGIFOAM ABS GEL 100 (HEMOSTASIS) ×2 IMPLANT
STRIP CLOSURE SKIN 1/2X4 (GAUZE/BANDAGES/DRESSINGS) ×4 IMPLANT
SURGIFLO W/THROMBIN 8M KIT (HEMOSTASIS) IMPLANT
SUT MNCRL AB 4-0 PS2 18 (SUTURE) ×2 IMPLANT
SUT VIC AB 0 CT1 18XCR BRD 8 (SUTURE) ×1 IMPLANT
SUT VIC AB 0 CT1 8-18 (SUTURE) ×1
SUT VIC AB 1 CT1 18XCR BRD 8 (SUTURE) ×1 IMPLANT
SUT VIC AB 1 CT1 8-18 (SUTURE) ×1
SUT VIC AB 2-0 CT2 18 VCP726D (SUTURE) ×2 IMPLANT
SYR 20ML LL LF (SYRINGE) ×6 IMPLANT
SYR BULB IRRIG 60ML STRL (SYRINGE) ×2 IMPLANT
SYR CONTROL 10ML LL (SYRINGE) ×4 IMPLANT
SYR TB 1ML LUER SLIP (SYRINGE) ×2 IMPLANT
TAP EXPEDIUM DL 4.35 (INSTRUMENTS) ×2 IMPLANT
TAP EXPEDIUM DL 5.0 (INSTRUMENTS) ×2 IMPLANT
TAP EXPEDIUM DL 6.0 (INSTRUMENTS) ×2 IMPLANT
TAPE CLOTH SURG 6X10 WHT LF (GAUZE/BANDAGES/DRESSINGS) ×2 IMPLANT
TRAY FOLEY MTR SLVR 16FR STAT (SET/KITS/TRAYS/PACK) ×2 IMPLANT
WATER STERILE IRR 1000ML POUR (IV SOLUTION) ×2 IMPLANT
YANKAUER SUCT BULB TIP NO VENT (SUCTIONS) ×2 IMPLANT

## 2020-02-25 NOTE — Anesthesia Procedure Notes (Signed)
Procedure Name: Intubation Date/Time: 02/25/2020 7:37 AM Performed by: Rande Brunt, CRNA Pre-anesthesia Checklist: Patient identified, Emergency Drugs available, Suction available and Patient being monitored Patient Re-evaluated:Patient Re-evaluated prior to induction Oxygen Delivery Method: Circle System Utilized Preoxygenation: Pre-oxygenation with 100% oxygen Induction Type: IV induction Ventilation: Mask ventilation without difficulty Laryngoscope Size: Mac and 4 Grade View: Grade I Tube type: Oral Tube size: 7.5 mm Number of attempts: 1 Airway Equipment and Method: Stylet and Oral airway Placement Confirmation: ETT inserted through vocal cords under direct vision,  positive ETCO2 and breath sounds checked- equal and bilateral Secured at: 22 cm Tube secured with: Tape Dental Injury: Teeth and Oropharynx as per pre-operative assessment

## 2020-02-25 NOTE — H&P (Signed)
Patient tolerated stage 1 of his surgery well. Will proceed with stage 2 as scheduled.

## 2020-02-25 NOTE — Anesthesia Postprocedure Evaluation (Signed)
Anesthesia Post Note  Patient: [name]  Procedure(s) Performed: POSTERIOR SPINAL FUSION LUMBAR 3- LUMBAR 4 WITH INSTRUMENTATION AND ALLOGRAFT (Spine Lumbar)     Patient location during evaluation: PACU Anesthesia Type: General Level of consciousness: awake and alert Pain management: pain level controlled Vital Signs Assessment: post-procedure vital signs reviewed and stable Respiratory status: spontaneous breathing, nonlabored ventilation, respiratory function stable and patient connected to nasal cannula oxygen Cardiovascular status: blood pressure returned to baseline and stable Postop Assessment: no apparent nausea or vomiting Anesthetic complications: no   No complications documented.  Last Vitals:  Vitals:   02/25/20 1110 02/25/20 1125  BP: 126/65 115/65  Pulse: 66 70  Resp: (!) 24 18  Temp:    SpO2: 100% 100%                   Adam Jones

## 2020-02-25 NOTE — Transfer of Care (Signed)
Immediate Anesthesia Transfer of Care Note  Patient: Adam Jones  Procedure(s) Performed: POSTERIOR SPINAL FUSION LUMBAR 3- LUMBAR 4 WITH INSTRUMENTATION AND ALLOGRAFT (Spine Lumbar)  Patient Location: PACU  Anesthesia Type:General  Level of Consciousness: awake, drowsy and patient cooperative  Airway & Oxygen Therapy: Patient Spontanous Breathing and Patient connected to face mask oxygen  Post-op Assessment: Report given to RN, Post -op Vital signs reviewed and stable and Patient moving all extremities  Post vital signs: Reviewed and stable  Last Vitals:  Vitals Value Taken Time  BP 107/63 02/25/20 1025  Temp    Pulse 69 02/25/20 1026  Resp 27 02/25/20 1026  SpO2 95 % 02/25/20 1026  Vitals shown include unvalidated device data.  Last Pain:  Vitals:   02/25/20 0436  TempSrc: Oral  PainSc:       Patients Stated Pain Goal: 3 (79/39/03 0092)  Complications: No complications documented.

## 2020-02-25 NOTE — Op Note (Addendum)
PATIENT NAME: Adam Jones  MEDICAL RECORD NO.:  [id number]  DATE OF BIRTH: [birth year]  DATE OF PROCEDURE: 02/25/2020  OPERATIVE REPORT   PREOPERATIVE DIAGNOSES: 1.Severe bilateral lumbar radiculopathy and stenosis at L3/4 2. S/p lateral fusion at L3/4 on 02/24/2020 3. Status post previous L4-S1 fusion  POSTOPERATIVE DIAGNOSES: 1.Severe bilateral lumbar radiculopathy and stenosis at L3/4 2. S/p lateral fusion at L3/4 on 02/24/2020 3. Status post previous L4-S1 fusion  PROCEDURES: 1.L3/4decompression 2.L3/4 posterolateral fusion. 3. Placement ofsegmental bilateral posterior instrumentation L3 (connected to the previously-placed L4 through S1 instrumentation construct) 4. Use of local autograft. 5. Use of morselized allograft - DBX putty 6. Intraoperative use of fluoroscopy.  SURGEON: Phylliss Bob, MD.  ASSISTANT: None  ANESTHESIA: General endotracheal anesthesia.  COMPLICATIONS: None.  DISPOSITION: Stable.  ESTIMATED BLOOD LOSS:100cc  INDICATIONS FOR SURGERY: Briefly, Adam Jones is a pleasant 74 year old male who is status post a previous L4-S1 fusion.  He did very well from that surgery, but more recently, has been having pain in his bilateral legs. A CAT scan was notable for successful fusion across L4-S1 an MRI was notable for severe stenosis at L3-4.Marland Kitchen  Given the patient's severe and debilitating bilateral leg pain, we did discuss proceeding with the staged surgery noted above.  The patient was informed of the risks and limitations associated with surgery, and did elect to proceed.  The patient did wish to proceed.   OPERATIVE DETAILS: On12/28/2021, the patient was brought to surgery and general endotracheal anesthesia was administered. The patient was placed prone on a well-padded flat Jackson bed with a spinal frame. Antibiotics were given and a time-out procedure was performed. The back was prepped and  draped in the usual fashion. A midline incision was made overlying the L3-4, L4-5 and L5-S1 intervertebral spaces. The fascia was incisedatthe midline. The paraspinal musculature was bluntly swept laterally.  The previously placed instrumentation spanning L4-5 and L5-S1 was identified. Anatomic landmarks for the L3 pedicles were exposed. Using fluoroscopy, I did cannulate the L3 pedicles bilaterally, using a medial to lateral cortical trajectory technique.At this point, a 6 x 35 mm screws were placed into the bilateral pedicles.  At this point, I proceeded with the decompressive aspect of the procedure.  Using a high-speed bur and a series of Kerrison punches, I did perform a partial facetectomy bilaterally at L3-4.  I was very pleased with the decompression.  At this point, 55 mm rods were secured into the tulip heads of the screws at L3.  The rods were extended medially, adjacent to the L4-S1 fusion construct.  Cross connectors were secured at the inferior aspect of the rod, and connected to the construct between the L4 and L5 pedicle screws.  Caps were placed and a final locking procedure was performed at L3, and the cross connectors were locked as well.  I did liberally use AP and lateral fluoroscopy throughout the procedure. I was very pleased with the final fusion construct.  I did decorticate the bilateral facet joints and posterior lateral gutters at L3-4, and DBX putty was packed into the facet joints and posterior lateral gutters to help aid in the success of the fusion. The wound was copiously irrigated with a total of approximately 3 L prior to placing the bone graft.The wound was  explored for any undue bleeding and there was no substantial bleeding encountered. Gel-Foam was placed over the laminectomy site. The wound was then closed in layers using #1 Vicryl followed by 2-0 Vicryl, followed by 4-0 Monocryl. Benzoin and  Steri-Strips were applied followed by sterile dressing.     Phylliss Bob, MD

## 2020-02-26 LAB — GLUCOSE, CAPILLARY: Glucose-Capillary: 122 mg/dL — ABNORMAL HIGH (ref 70–99)

## 2020-02-26 MED ORDER — OXYCODONE HCL 10 MG PO TABS: 10.0000 mg | ORAL_TABLET | ORAL | 0 refills | Status: AC | PRN

## 2020-02-26 MED ORDER — OXYCODONE HCL ER 10 MG PO T12A: 10.0000 mg | EXTENDED_RELEASE_TABLET | Freq: Two times a day (BID) | ORAL | 0 refills | Status: AC

## 2020-02-26 MED FILL — Thrombin For Soln Kit 20000 Unit: CUTANEOUS | Qty: 1 | Status: AC

## 2020-02-26 NOTE — Progress Notes (Signed)
    Patient doing well  Reports expected LBP Denies leg pain   Physical Exam: Vitals:   02/26/20 0532 02/26/20 0745  BP: 107/64 98/66  Pulse: 88 (!) 58  Resp:  16  Temp:  98.6 F (37 C)  SpO2:  98%    Dressing in place NVI  Pt s/p lateral/posterior decompression and fusion, doing well  - up with PT/OT, encourage ambulation - Percocet/Oxycontin for pain, Robaxin for muscle spasms - d/c home today with f/u in 2 weeks

## 2020-02-26 NOTE — Evaluation (Signed)
Physical Therapy Evaluation Patient Details Name: Adam Jones MRN: [id number] DOB: 1945-04-22 Today's Date: 02/26/2020   History of Present Illness  74 y.o. male presenting with severe bilateral lumbar stenosis and radiculopathy s/p lateral fusion on 12/29 by Dr. Lynann Bologna. Patient then underwent stage 2 including L4/5 PLIF and decompression on 12/30. PMHx significant for anxiety, BPH, CAD, CKD, DM, HTN, OA, and PVD.  Clinical Impression   Patient received sitting at EOB, very pleasant and cooperative today but still only able to recall 2/3 back precautions. Education provided to patient and his spouse regarding back precautions, brace application/mechanics, bed mobility, car transfers, and safe use of RW through lens of back precautions; also encouraged him to use ramp to enter home for now. Tolerated mobility well at a min guard to MinA level but did need frequent reminders to maintain back precautions and to improve upright posture/distance from RW with gait. Did need a bit of help keeping good alignment with rolling in bed, as well as step by step cues for log rolling and side<->sit transfers. Left sitting at EOB with all needs met, spouse present. Will benefit from skilled HHPT follow-up moving forward.     Follow Up Recommendations Home health PT;Supervision for mobility/OOB    Equipment Recommendations  Rolling walker with 5" wheels;3in1 (PT)    Recommendations for Other Services       Precautions / Restrictions Precautions Precautions: Back;Fall Precaution Booklet Issued: Yes (comment) Precaution Comments: Patient able to recall 2/3 back precautions. Cues for adherence during functional activities. Required Braces or Orthoses: Spinal Brace Spinal Brace: Thoracolumbosacral orthotic;Applied in sitting position Restrictions Weight Bearing Restrictions: No      Mobility  Bed Mobility Overal bed mobility: Needs Assistance Bed Mobility: Rolling;Sidelying to Sit;Sit to  Sidelying Rolling: Min assist Sidelying to sit: Min guard     Sit to sidelying: Min assist General bed mobility comments: minA to help keep back in good alignment with log rolling, min guard to get from side to sit but did need MinA to manage BLEs with sit to sidelying    Transfers Overall transfer level: Needs assistance Equipment used: Rolling walker (2 wheeled) Transfers: Sit to/from Stand Sit to Stand: Min guard         General transfer comment: min guard for transfers, cues for hand placement and back precautions  Ambulation/Gait Ambulation/Gait assistance: Min guard Gait Distance (Feet): 200 Feet Assistive device: Rolling walker (2 wheeled) Gait Pattern/deviations: Step-through pattern;Decreased step length - right;Decreased step length - left;Decreased stride length;Trunk flexed;Drifts right/left Gait velocity: decreased   General Gait Details: slow but steady with RW, did need cues for keeping RW at an appropriate distance and upright posture, also tended to have left drift especially towards latter part of gait, not sure if this was from fatigue or just general reduced awareness  Stairs            Wheelchair Mobility    Modified Rankin (Stroke Patients Only)       Balance Overall balance assessment: Mild deficits observed, not formally tested                                           Pertinent Vitals/Pain Pain Assessment: 0-10 Pain Score: 6  Pain Location: Back Pain Descriptors / Indicators: Aching;Grimacing Pain Intervention(s): Limited activity within patient's tolerance;Monitored during session;Repositioned    Home Living Family/patient expects to be discharged to:: Private  residence Living Arrangements: Spouse/significant other Available Help at Discharge: Family;Available 24 hours/day Type of Home: House Home Access: Stairs to enter;Other (comment) (Alternate entry with ramp) Entrance Stairs-Rails: None Entrance  Stairs-Number of Steps: 3-4 Home Layout: One level Home Equipment: Walker - 2 wheels;Bedside commode;Wheelchair - power;Cane - quad;Shower seat      Prior Function Level of Independence: Independent         Comments: Independent with ADLs/IADLs without AD. Patient was still driving.     Hand Dominance   Dominant Hand: Right    Extremity/Trunk Assessment   Upper Extremity Assessment Upper Extremity Assessment: Defer to OT evaluation    Lower Extremity Assessment Lower Extremity Assessment: Generalized weakness    Cervical / Trunk Assessment Cervical / Trunk Assessment: Other exceptions Cervical / Trunk Exceptions: s/p spinal sx.  Communication   Communication: No difficulties  Cognition Arousal/Alertness: Awake/alert Behavior During Therapy: WFL for tasks assessed/performed Overall Cognitive Status: Impaired/Different from baseline Area of Impairment: Memory;Safety/judgement;Problem solving;Awareness                     Memory: Decreased short-term memory;Decreased recall of precautions   Safety/Judgement: Decreased awareness of safety Awareness: Intellectual Problem Solving: Difficulty sequencing;Requires verbal cues General Comments: still with some limited awareness of precautions especially in dynamic mobility situations, still could only recall 2/3 precautions this morning      General Comments General comments (skin integrity, edema, etc.): Clean, dry dressing at incision.    Exercises     Assessment/Plan    PT Assessment Patient needs continued PT services  PT Problem List Decreased strength;Decreased cognition;Decreased knowledge of use of DME;Decreased activity tolerance;Decreased safety awareness;Decreased balance;Decreased knowledge of precautions;Pain;Decreased mobility;Decreased coordination       PT Treatment Interventions DME instruction;Balance training;Gait training;Stair training;Cognitive remediation;Functional mobility  training;Patient/family education;Therapeutic activities;Therapeutic exercise    PT Goals (Current goals can be found in the Care Plan section)  Acute Rehab PT Goals Patient Stated Goal: To return home. PT Goal Formulation: With patient/family Time For Goal Achievement: 03/11/20 Potential to Achieve Goals: Good    Frequency Min 5X/week   Barriers to discharge        Co-evaluation               AM-PAC PT "6 Clicks" Mobility  Outcome Measure Help needed turning from your back to your side while in a flat bed without using bedrails?: A Little Help needed moving from lying on your back to sitting on the side of a flat bed without using bedrails?: A Little Help needed moving to and from a bed to a chair (including a wheelchair)?: A Little Help needed standing up from a chair using your arms (e.g., wheelchair or bedside chair)?: A Little Help needed to walk in hospital room?: A Little Help needed climbing 3-5 steps with a railing? : A Lot 6 Click Score: 17    End of Session Equipment Utilized During Treatment: Back brace Activity Tolerance: Patient tolerated treatment well Patient left: in bed;with call bell/phone within reach;with family/visitor present (sitting at EOB) Nurse Communication: Mobility status PT Visit Diagnosis: Unsteadiness on feet (R26.81);Difficulty in walking, not elsewhere classified (R26.2);Pain;Muscle weakness (generalized) (M62.81) Pain - Right/Left:  (midline) Pain - part of body:  (back)    Time: 1001-1020 PT Time Calculation (min) (ACUTE ONLY): 19 min   Charges:   PT Evaluation $PT Eval Moderate Complexity: 1 Mod          Jenny Omdahl U PT, DPT, PN1   Supplemental Physical Therapist  [place]    Pager [phone] Acute Rehab Office [phone]

## 2020-02-26 NOTE — Evaluation (Signed)
Occupational Therapy Evaluation Patient Details Name: Adam Jones MRN: [id number] DOB: 11/13/1945 Today's Date: 02/26/2020    History of Present Illness 74 y.o. male presenting with severe bilateral lumbar stenosis and radiculopathy s/p lateral fusion on 12/29 by Dr. Lynann Bologna. Patient then underwent stage 2 including L4/5 PLIF and decompression on 12/30. PMHx significant for anxiety, BPH, CAD, CKD, DM, HTN, OA, and PVD.   Clinical Impression   PTA patient was living with his wife and was independent with ADLs/IADLs including driving. Patient was also ambulating in home and community dwellings independently. Patient currently presents below baseline level of function demonstrating supervision to [place] A grossly for self-care tasks with use of AE including sock-aid and reacher. Patient received medication for pain prior to start of session leaving patient feeling "loopy". Education provided to patient and then to wife on back precautions, home set-up to maximize safety and independence with self-care tasks and acquisition and use of AE for LB bathing/dressing. Patient and wife expressed verbal understanding. Patient would benefit from 1 more acute OT treatment session to maximize safety and independence and knowledge of precautions in prep for safe d/c home with wife. No follow-up OT needs at this time.     Follow Up Recommendations  No OT follow up;Supervision - Intermittent    Equipment Recommendations  None recommended by OT (Patient has necessary DME)    Recommendations for Other Services       Precautions / Restrictions Precautions Precautions: Back;Fall Precaution Booklet Issued: Yes (comment) Precaution Comments: Patient able to recall 2/3 back precautions. Cues for adherence during functional activities. Required Braces or Orthoses: Spinal Brace Spinal Brace: Thoracolumbosacral orthotic;Applied in standing position Restrictions Weight Bearing Restrictions: No      Mobility Bed  Mobility Overal bed mobility: Needs Assistance Bed Mobility: Rolling;Sidelying to Sit Rolling: Supervision Sidelying to sit: Min guard       General bed mobility comments: Supervision A for rolling to R with cues for log-rolling technique. Min guard to elevate trunk.    Transfers Overall transfer level: Needs assistance Equipment used: Rolling walker (2 wheeled) Transfers: Sit to/from Stand Sit to Stand: Min guard         General transfer comment: Min guard for sit to stand from slightly elevated EOB to RW x2 trials.    Balance Overall balance assessment: Mild deficits observed, not formally tested                                         ADL either performed or assessed with clinical judgement   ADL Overall ADL's : Needs assistance/impaired                 Upper Body Dressing : Set up;Sitting   Lower Body Dressing: Minimal assistance;Sit to/from stand Lower Body Dressing Details (indicate cue type and reason): Min A with use of AD. Cues for back precautions. Toilet Transfer: Administrator and Hygiene: Min guard;Sit to/from stand       Functional mobility during ADLs: Min guard;Rolling walker General ADL Comments: Min guard for safety with use of RW 2/2 feeling "loopy".     Vision Patient Visual Report: No change from baseline Vision Assessment?: No apparent visual deficits     Perception     Praxis      Pertinent Vitals/Pain Pain Assessment: 0-10 Pain Score: 6  Pain Location: Back Pain Descriptors / Indicators:  Aching;Grimacing Pain Intervention(s): Limited activity within patient's tolerance;Monitored during session;Premedicated before session;Repositioned     Hand Dominance Right   Extremity/Trunk Assessment Upper Extremity Assessment Upper Extremity Assessment: Overall WFL for tasks assessed   Lower Extremity Assessment Lower Extremity Assessment: Defer to PT evaluation   Cervical /  Trunk Assessment Cervical / Trunk Assessment: Other exceptions Cervical / Trunk Exceptions: s/p spinal sx.   Communication Communication Communication: No difficulties   Cognition Arousal/Alertness: Awake/alert Behavior During Therapy: Restless Overall Cognitive Status: Impaired/Different from baseline                                 General Comments: Patient "loopy" from pain medication. Somewhat unaware but this improved as the session progressed.   General Comments  Clean, dry dressing at incision.    Exercises     Shoulder Instructions      Home Living Family/patient expects to be discharged to:: Private residence Living Arrangements: Spouse/significant other Available Help at Discharge: Family;Available 24 hours/day Type of Home: House Home Access: Stairs to enter;Other (comment) (Alternate entry with ramp) Entrance Stairs-Number of Steps: 3-4 Entrance Stairs-Rails: None Home Layout: One level     Bathroom Shower/Tub: Occupational psychologist: Handicapped height     Home Equipment: Environmental consultant - 2 wheels;Bedside commode;Wheelchair - power;Cane - quad;Shower seat          Prior Functioning/Environment Level of Independence: Independent        Comments: Independent with ADLs/IADLs without AD. Patient was still driving.        OT Problem List: Pain      OT Treatment/Interventions: Self-care/ADL training;Therapeutic exercise;Energy conservation;DME and/or AE instruction;Patient/family education;Balance training    OT Goals(Current goals can be found in the care plan section) Acute Rehab OT Goals Patient Stated Goal: To return home. OT Goal Formulation: With patient Time For Goal Achievement: 03/11/20 Potential to Achieve Goals: Good ADL Goals Pt Will Perform Lower Body Dressing: with modified independence;sit to/from stand;with adaptive equipment Pt Will Transfer to Toilet: with modified independence;ambulating;bedside commode Pt Will  Perform Toileting - Clothing Manipulation and hygiene: with modified independence;sit to/from stand Additional ADL Goal #1: Patient will recall 3/3 back precautions in prep for ADLs demonstrating good adherence without cueing.  OT Frequency: Other (comment) (1 more follow-up visit prior to d/c)   Barriers to D/C:            Co-evaluation              AM-PAC OT "6 Clicks" Daily Activity     Outcome Measure Help from another person eating meals?: None Help from another person taking care of personal grooming?: A Little Help from another person toileting, which includes using toliet, bedpan, or urinal?: A Little Help from another person bathing (including washing, rinsing, drying)?: A Little Help from another person to put on and taking off regular upper body clothing?: None Help from another person to put on and taking off regular lower body clothing?: A Little 6 Click Score: 20   End of Session Equipment Utilized During Treatment: Gait belt;Rolling walker;Other (comment);Back brace (AE for LB dressing) Nurse Communication: Mobility status  Activity Tolerance: Patient tolerated treatment well Patient left: in chair;with call bell/phone within reach  OT Visit Diagnosis: Pain Pain - part of body:  (Back)                Time: 6734-1937 OT Time Calculation (min): 24 min Charges:  OT General Charges $OT Visit: 1 Visit OT Evaluation $OT Eval Low Complexity: 1 Low OT Treatments $Self Care/Home Management : 8-22 mins  Evagelia Knack H. OTR/L Supplemental OT, Department of rehab services [phone]  Tamorah Hada R H. 02/26/2020, 9:36 AM

## 2020-02-26 NOTE — Progress Notes (Signed)
Patient alert and oriented, mae's well, voiding adequate amount of urine, swallowing without difficulty, no c/o pain at time of discharge. Patient discharged home with family. Script and discharged instructions given to patient. Patient and family stated understanding of instructions given. Patient will follow-up for Home health therapy recommendation after follow-up with MD In 2 weeks.

## 2020-02-29 ENCOUNTER — Encounter (HOSPITAL_COMMUNITY): Payer: Self-pay | Admitting: Orthopedic Surgery

## 2020-03-04 ENCOUNTER — Encounter (HOSPITAL_COMMUNITY): Payer: Self-pay | Admitting: Orthopedic Surgery

## 2020-03-09 ENCOUNTER — Encounter (HOSPITAL_COMMUNITY): Payer: Self-pay | Admitting: Orthopedic Surgery

## 2020-03-15 NOTE — Discharge Summary (Signed)
Patient ID: Adam Jones MRN: [id number] DOB/AGE: [birth year] 75 y.o.  Admit date: 02/24/2020 Discharge date: 02/26/2020  Admission Diagnoses:  Active Problems:   Lumbar radiculopathy   Discharge Diagnoses:  Same  Past Medical History:  Diagnosis Date  . Anemia   . Anxiety   . BPH (benign prostatic hyperplasia)   . Carotid artery stenosis   . Cataracts, bilateral   . Chronic kidney disease   . Diabetes mellitus without complication ([place])   . GERD (gastroesophageal reflux disease)   . History of kidney stones   . Hypercholesteremia   . Hypertension   . Hypogonadism in male   . Hypothyroidism   . Low back pain   . Lumbar radiculopathy   . Osteoarthritis   . Peripheral vascular disease ([place])   . Pneumonia   . RLS (restless legs syndrome)     Surgeries: Procedure(s): POSTERIOR SPINAL FUSION LUMBAR 3- LUMBAR 4 WITH INSTRUMENTATION AND ALLOGRAFT on 02/25/2020   Consultants: None  Discharged Condition: Improved  Hospital Course: Adam Jones is an 75 y.o. male who was admitted 02/24/2020 for operative treatment of Radiculopathy. Patient has severe unremitting pain that affects sleep, daily activities, and work/hobbies. After pre-op clearance the patient was taken to the operating room on 02/25/2020 and underwent  Procedure(s): POSTERIOR SPINAL FUSION LUMBAR 3- LUMBAR 4 WITH INSTRUMENTATION AND ALLOGRAFT.    Patient was given perioperative antibiotics:  Anti-infectives (From admission, onward)   Start     Dose/Rate Route Frequency Ordered Stop   02/26/20 0600  ceFAZolin (ANCEF) IVPB 2g/100 mL premix  Status:  Discontinued        2 g 200 mL/hr over 30 Minutes Intravenous On call to O.R. 02/25/20 1200 02/26/20 0014   02/25/20 0721  ceFAZolin (ANCEF) 2-4 GM/100ML-% IVPB       Note to Pharmacy: Junie Bame   : cabinet override      02/25/20 0721 02/25/20 1929   02/24/20 1600  ceFAZolin (ANCEF) IVPB 2g/100 mL premix        2 g 200 mL/hr over 30 Minutes  Intravenous Every 8 hours 02/24/20 1255 02/24/20 2342   02/24/20 0600  ceFAZolin (ANCEF) IVPB 2g/100 mL premix        2 g 200 mL/hr over 30 Minutes Intravenous On call to O.R. 02/24/20 0547 02/24/20 0742       Patient was given sequential compression devices, early ambulation to prevent DVT.  Patient benefited maximally from hospital stay and there were no complications.    Recent vital signs: BP 98/66 (BP Location: Left Arm)   Pulse (!) 58   Temp 98.6 F (37 C) (Oral)   Resp 16   Ht 5' 10"  (1.778 m)   Wt 94.2 kg   SpO2 98%   BMI 29.80 kg/m    Discharge Medications:   Allergies as of 02/26/2020      Reactions   Levothyroxine    Kept him awake   Metformin Other (See Comments)   Lactic acidosis   Venlafaxine Nausea Only   Zolpidem Diarrhea   Diclofenac Sodium Nausea And Vomiting   Hydrocodone Itching      Iodinated Diagnostic Agents Hives, Rash   Iodine Rash   Betadine is ok no reaction to that      Medication List    TAKE these medications   allopurinol 100 MG tablet Commonly known as: ZYLOPRIM Take 100 mg by mouth daily.   amLODipine 10 MG tablet Commonly known as: NORVASC Take 10 mg  by mouth daily.   APPLE CIDER VINEGAR PO Take 450 mg by mouth 3 (three) times daily.   aspirin EC 81 MG tablet Take 81 mg by mouth daily in the afternoon. Swallow whole.   atorvastatin 80 MG tablet Commonly known as: LIPITOR Take 80 mg by mouth daily in the afternoon.   carvedilol 25 MG tablet Commonly known as: COREG Take 25 mg by mouth 2 (two) times daily with a meal.   clonazePAM 1 MG tablet Commonly known as: KLONOPIN Take 1 mg by mouth 3 (three) times daily.   clopidogrel 75 MG tablet Commonly known as: PLAVIX Take 75 mg by mouth daily in the afternoon.   diphenhydrAMINE 25 MG tablet Commonly known as: BENADRYL Take 25 mg by mouth daily as needed for allergies.   docusate sodium 100 MG capsule Commonly known as: COLACE Take 300 mg by mouth daily.    doxazosin 4 MG tablet Commonly known as: CARDURA Take 4 mg by mouth 2 (two) times daily.   famotidine 20 MG tablet Commonly known as: PEPCID Take 40 mg by mouth daily.   fenofibrate 160 MG tablet Take 160 mg by mouth daily.   fentaNYL 25 MCG/HR Commonly known as: [place] 1 patch onto the skin every other day.   fentaNYL 12 MCG/HR Commonly known as: Ball 1 patch onto the skin every other day.   ferrous sulfate 325 (65 FE) MG tablet Take 325 mg by mouth daily.   FIBER PO Take 3 capsules by mouth daily.   Fish Oil 1000 MG Caps Take 1,000 mg by mouth daily.   glipiZIDE 10 MG 24 hr tablet Commonly known as: GLUCOTROL XL Take 10 mg by mouth daily.   hydrALAZINE 25 MG tablet Commonly known as: APRESOLINE Take 25 mg by mouth 3 (three) times daily.   levothyroxine 75 MCG tablet Commonly known as: SYNTHROID Take 75 mcg by mouth daily before breakfast.   linagliptin 5 MG Tabs tablet Commonly known as: TRADJENTA Take 5 mg by mouth daily in the afternoon.   lisinopril 40 MG tablet Commonly known as: ZESTRIL Take 40 mg by mouth daily.   Magnesium 400 MG Tabs Take 400 mg by mouth daily.   Melatonin 5 MG Caps Take 5 mg by mouth at bedtime.   multivitamin with minerals Tabs tablet Take 1 tablet by mouth daily.   Narcan 4 MG/0.1ML Liqd nasal spray kit Generic drug: naloxone Place 1 spray into the nose as needed (opioid overdose).   oxyCODONE 10 mg 12 hr tablet Commonly known as: OXYCONTIN Take 1 tablet (10 mg total) by mouth every 12 (twelve) hours. What changed: You were already taking a medication with the same name, and this prescription was added. Make sure you understand how and when to take each.   Oxycodone HCl 10 MG Tabs Take 1 tablet (10 mg total) by mouth every 4 (four) hours as needed. What changed: reasons to take this   Potassium Citrate 15 MEQ (1620 MG) Tbcr Take 1 tablet by mouth daily.   Testosterone 1.62 % Gel Place 3 Pump  onto the skin every other day.   traZODone 50 MG tablet Commonly known as: DESYREL Take 100 mg by mouth at bedtime.   TURMERIC PO Take 1 tablet by mouth daily.       Diagnostic Studies: DG Lumbar Spine 2-3 Views  Result Date: 02/25/2020 CLINICAL DATA:  Lumbar fusion. EXAM: LUMBAR SPINE - 2-3 VIEW; DG C-ARM 1-60 MIN COMPARISON:  Lumbar spine x-rays from  yesterday. FLUOROSCOPY TIME:  21 seconds. C-arm fluoroscopic images were obtained intraoperatively and submitted for post operative interpretation. FINDINGS: AP and lateral intraoperative fluoroscopic images demonstrate interval extension of the L4-S1 posterior fusion to L3 with new bilateral L3 pedicle screws. L3-L4 XLIF again noted. IMPRESSION: Intraoperative fluoroscopic guidance for L4-S1 posterior fusion extension to L3. Electronically Signed   By: Titus Dubin M.D.   On: 02/25/2020 10:22   DG Lumbar Spine 2-3 Views  Result Date: 02/24/2020 CLINICAL DATA:  Post lumbar fusion EXAM: LUMBAR SPINE - 2-3 VIEW; DG C-ARM 1-60 MIN COMPARISON:  None. FINDINGS: Frontal and lateral views demonstrate new left lateral plate screw fixation at L3-L4. Interbody spacer is also present at this level. Partially imaged prior fusion at L4-S1 with rods and pedicle screws and interbody spacers. No unexpected radiopaque foreign body. IMPRESSION: Post XLIF L3-L4.  No unexpected radiopaque foreign body. These results were called by telephone at the time of interpretation on 02/24/2020 at 9:56 am to OR 5. Electronically Signed   By: Macy Mis M.D.   On: 02/24/2020 09:58   DG Lumbar Spine 1 View  Result Date: 02/25/2020 CLINICAL DATA:  Lumbar fusion. EXAM: LUMBAR SPINE - 1 VIEW COMPARISON:  CT scan of January 20, 2020. FINDINGS: Single intraoperative cross-table lateral projection was obtained of the lumbar spine. Patient is status post surgical posterior fusion of L4-5 and L5-S1 with bilateral intrapedicular screw placement and interbody fusion. Surgical  probes are seen at the approximately L2-3 and L4-5 levels. IMPRESSION: Status post surgical posterior fusion of L4-5 and L5-S1. Surgical probes are seen at the approximately L2-3 and L4-5 levels. Electronically Signed   By: Marijo Conception M.D.   On: 02/25/2020 10:13   DG C-Arm 1-60 Min  Result Date: 02/25/2020 CLINICAL DATA:  Lumbar fusion. EXAM: LUMBAR SPINE - 2-3 VIEW; DG C-ARM 1-60 MIN COMPARISON:  Lumbar spine x-rays from yesterday. FLUOROSCOPY TIME:  21 seconds. C-arm fluoroscopic images were obtained intraoperatively and submitted for post operative interpretation. FINDINGS: AP and lateral intraoperative fluoroscopic images demonstrate interval extension of the L4-S1 posterior fusion to L3 with new bilateral L3 pedicle screws. L3-L4 XLIF again noted. IMPRESSION: Intraoperative fluoroscopic guidance for L4-S1 posterior fusion extension to L3. Electronically Signed   By: Titus Dubin M.D.   On: 02/25/2020 10:22   DG C-Arm 1-60 Min  Result Date: 02/24/2020 CLINICAL DATA:  Post lumbar fusion EXAM: LUMBAR SPINE - 2-3 VIEW; DG C-ARM 1-60 MIN COMPARISON:  None. FINDINGS: Frontal and lateral views demonstrate new left lateral plate screw fixation at L3-L4. Interbody spacer is also present at this level. Partially imaged prior fusion at L4-S1 with rods and pedicle screws and interbody spacers. No unexpected radiopaque foreign body. IMPRESSION: Post XLIF L3-L4.  No unexpected radiopaque foreign body. These results were called by telephone at the time of interpretation on 02/24/2020 at 9:56 am to OR 5. Electronically Signed   By: Macy Mis M.D.   On: 02/24/2020 09:58    Disposition: Discharge disposition: 01-Home or Self Care       Discharge Instructions    Incentive spirometry RT   Complete by: As directed      Pt s/p lateral/posterior decompression and fusion, doing well  - up with PT/OT, encourage ambulation - Percocet/Oxycontin for pain, Robaxin for muscle spasms -D/C  instructions sheet printed and in chart -D/C today  -F/U in office 2 weeks   Signed: Justice Britain 03/15/2020, 10:42 AM

## 2021-02-21 ENCOUNTER — Other Ambulatory Visit: Payer: Self-pay | Admitting: Orthopedic Surgery

## 2021-02-21 DIAGNOSIS — M545 Low back pain, unspecified: Secondary | ICD-10-CM

## 2021-03-14 ENCOUNTER — Telehealth: Payer: Self-pay

## 2021-03-14 MED ORDER — PREDNISONE 50 MG PO TABS: ORAL_TABLET | ORAL | 0 refills | Status: DC

## 2021-03-14 NOTE — Progress Notes (Signed)
Phone call to patient to review instructions for 13 hr prep for SI joint injection w/ contrast on 03/20/21 at 12:30. Prescription called into [facility]. Pt aware and verbalized understanding of instructions.  Prescription: Pt to take 50 mg of prednisone on 03/22/21 at 11:30 PM, 50 mg of prednisone on 03/23/21 at 5:30 AM, and 50 mg of prednisone on 03/23/21 at 11:30 AM. Pt is also to take 50 mg of benadryl on 03/23/21 at 11:30 AM. Please call [phone] with any questions.

## 2021-03-23 ENCOUNTER — Ambulatory Visit
Admission: RE | Admit: 2021-03-23 | Discharge: 2021-03-23 | Disposition: A | Discharge status: Home / Self Care | Payer: Medicare HMO | Source: Ambulatory Visit | Attending: Orthopedic Surgery | Admitting: Orthopedic Surgery

## 2021-03-23 DIAGNOSIS — M545 Low back pain, unspecified: Secondary | ICD-10-CM

## 2021-04-03 ENCOUNTER — Other Ambulatory Visit: Payer: Self-pay | Admitting: Orthopedic Surgery

## 2021-04-21 NOTE — Progress Notes (Addendum)
Surgical Instructions    Your procedure is scheduled on Thursday, March 2nd, 2023.   Report to [facility] Main Entrance "A" at 05:30 A.M., then check in with the Admitting office.  Call this number if you have problems the morning of surgery:  [phone]   If you have any questions prior to your surgery date call [phone]: Open Monday-Friday 8am-4pm    Remember:  Do not eat after midnight the night before your surgery  You may drink clear liquids until 04:30 the morning of your surgery.   Clear liquids allowed are: Water, Non-Citrus Juices (without pulp), Carbonated Beverages, Clear Tea, Black Coffee ONLY (NO MILK, CREAM OR POWDERED CREAMER of any kind), and Gatorade  Patient Instructions   The day of surgery (if you have diabetes): Drink ONE (1) 12 oz G2 given to you in your pre admission testing appointment by 04:30 the morning of surgery. Drink in one sitting. Do not sip.  This drink was given to you during your hospital  pre-op appointment visit.  Nothing else to drink after completing the  12 oz bottle of G2.         If you have questions, please contact your surgeons office.     Take these medicines the morning of surgery with A SIP OF WATER:   allopurinol (ZYLOPRIM)  amiodarone (PACERONE)  amLODipine (NORVASC)  atorvastatin (LIPITOR) carvedilol (COREG) clonazePAM (KLONOPIN)  doxazosin (CARDURA) famotidine (PEPCID) Fenofibrate hydrALAZINE (APRESOLINE)  levothyroxine (SYNTHROID)  oxyCODONE (OXYCONTIN)   If needed:  diphenhydrAMINE (BENADRYL) Oxycodone HCl    Follow your surgeon's instructions on when to stop Aspirin and Eliquis. If no instructions were given by your surgeon then you will need to call the office to get those instructions.     As of today, STOP taking any Aspirin (unless otherwise instructed by your surgeon) Aleve, Naproxen, Ibuprofen, Motrin, Advil, Goody's, BC's, all herbal medications, fish oil, and all vitamins.   WHAT DO I DO  ABOUT MY DIABETES MEDICATION?   Do not take glipiZIDE (GLUCOTROL XL) the night before surgery and the day of surgery Do not take linagliptin (TRADJENTA) the day of surgery   HOW TO MANAGE YOUR DIABETES BEFORE AND AFTER SURGERY  Why is it important to control my blood sugar before and after surgery? Improving blood sugar levels before and after surgery helps healing and can limit problems. A way of improving blood sugar control is eating a healthy diet by:  Eating less sugar and carbohydrates  Increasing activity/exercise  Talking with your doctor about reaching your blood sugar goals High blood sugars (greater than 180 mg/dL) can raise your risk of infections and slow your recovery, so you will need to focus on controlling your diabetes during the weeks before surgery. Make sure that the doctor who takes care of your diabetes knows about your planned surgery including the date and location.  How do I manage my blood sugar before surgery? Check your blood sugar at least 4 times a day, starting 2 days before surgery, to make sure that the level is not too high or low.  Check your blood sugar the morning of your surgery when you wake up and every 2 hours until you get to the Short Stay unit.  If your blood sugar is less than 70 mg/dL, you will need to treat for low blood sugar: Do not take insulin. Treat a low blood sugar (less than 70 mg/dL) with  cup of clear juice (cranberry or apple), 4 glucose tablets, OR glucose  gel. Recheck blood sugar in 15 minutes after treatment (to make sure it is greater than 70 mg/dL). If your blood sugar is not greater than 70 mg/dL on recheck, call [phone] for further instructions. Report your blood sugar to the short stay nurse when you get to Short Stay.  If you are admitted to the hospital after surgery: Your blood sugar will be checked by the staff and you will probably be given insulin after surgery (instead of oral diabetes medicines) to make  sure you have good blood sugar levels. The goal for blood sugar control after surgery is 80-180 mg/dL.              The day of surgery:  Do not wear jewelry  Do not wear lotions, powders, deodorant. Men may shave face and neck. Do not bring valuables to the hospital.   [facility] is not responsible for any belongings or valuables. .   Do NOT Smoke (Tobacco/Vaping)  24 hours prior to your procedure  If you use a CPAP at night, you may bring your mask for your overnight stay.   Contacts, glasses, hearing aids, dentures or partials may not be worn into surgery, please bring cases for these belongings   For patients admitted to the hospital, discharge time will be determined by your treatment team.   Patients discharged the day of surgery will not be allowed to drive home, and someone needs to stay with them for 24 hours.  NO VISITORS WILL BE ALLOWED IN PRE-OP WHERE PATIENTS ARE PREPPED FOR SURGERY.  ONLY 1 SUPPORT PERSON MAY BE PRESENT IN THE WAITING ROOM WHILE YOU ARE IN SURGERY.  IF YOU ARE TO BE ADMITTED, ONCE YOU ARE IN YOUR ROOM YOU WILL BE ALLOWED TWO (2) VISITORS. 1 (ONE) VISITOR MAY STAY OVERNIGHT BUT MUST ARRIVE TO THE ROOM BY 8pm.  Minor children may have two parents present. Special consideration for safety and communication needs will be reviewed on a case by case basis.  Special instructions:    Oral Hygiene is also important to reduce your risk of infection.  Remember - BRUSH YOUR TEETH THE MORNING OF SURGERY WITH YOUR REGULAR TOOTHPASTE   [facility]- Preparing For Surgery  Before surgery, you can play an important role. Because skin is not sterile, your skin needs to be as free of germs as possible. You can reduce the number of germs on your skin by washing with CHG (chlorahexidine gluconate) Soap before surgery.  CHG is an antiseptic cleaner which kills germs and bonds with the skin to continue killing germs even after washing.     Please do not use if you have an  allergy to CHG or antibacterial soaps. If your skin becomes reddened/irritated stop using the CHG.  Do not shave (including legs and underarms) for at least 48 hours prior to first CHG shower. It is OK to shave your face.  Please follow these instructions carefully.     Shower the NIGHT BEFORE SURGERY and the MORNING OF SURGERY with CHG Soap.   If you chose to wash your hair, wash your hair first as usual with your normal shampoo. After you shampoo, rinse your hair and body thoroughly to remove the shampoo.  Then [employer] and genitals (private parts) with your normal soap and rinse thoroughly to remove soap.  After that Use CHG Soap as you would any other liquid soap. You can apply CHG directly to the skin and wash gently with a scrungie or a clean  washcloth.   Apply the CHG Soap to your body ONLY FROM THE NECK DOWN.  Do not use on open wounds or open sores. Avoid contact with your eyes, ears, mouth and genitals (private parts). Wash Face and genitals (private parts)  with your normal soap.   Wash thoroughly, paying special attention to the area where your surgery will be performed.  Thoroughly rinse your body with warm water from the neck down.  DO NOT shower/wash with your normal soap after using and rinsing off the CHG Soap.  Pat yourself dry with a CLEAN TOWEL.  Wear CLEAN PAJAMAS to bed the night before surgery  Place CLEAN SHEETS on your bed the night before your surgery  DO NOT SLEEP WITH PETS.   Day of Surgery:  Take a shower with CHG soap. Wear Clean/Comfortable clothing the morning of surgery Do not apply any deodorants/lotions.   Remember to brush your teeth WITH YOUR REGULAR TOOTHPASTE.    COVID testing  If you are going to stay overnight or be admitted after your procedure/surgery and require a pre-op COVID test, please follow these instructions after your COVID test   You are not required to quarantine however you are required to wear a well-fitting mask when  you are out and around people not in your household.  If your mask becomes wet or soiled, replace with a new one.  Wash your hands often with soap and water for 20 seconds or clean your hands with an alcohol-based hand sanitizer that contains at least 60% alcohol.  Do not share personal items.  Notify your provider: if you are in close contact with someone who has COVID  or if you develop a fever of 100.4 or greater, sneezing, cough, sore throat, shortness of breath or body aches.    Please read over the following fact sheets that you were given.

## 2021-04-24 ENCOUNTER — Encounter (HOSPITAL_COMMUNITY)
Admission: RE | Admit: 2021-04-24 | Discharge: 2021-04-24 | Disposition: A | Discharge status: Home / Self Care | Payer: Medicare HMO | Source: Ambulatory Visit | Attending: Orthopedic Surgery | Admitting: Orthopedic Surgery

## 2021-04-24 ENCOUNTER — Other Ambulatory Visit: Payer: Self-pay

## 2021-04-24 ENCOUNTER — Encounter (HOSPITAL_COMMUNITY): Payer: Self-pay

## 2021-04-24 VITALS — BP 152/60 | HR 64 | Temp 97.7°F | Resp 18 | Ht 70.0 in | Wt 196.2 lb

## 2021-04-24 DIAGNOSIS — Z20822 Contact with and (suspected) exposure to covid-19: Secondary | ICD-10-CM | POA: Insufficient documentation

## 2021-04-24 DIAGNOSIS — Z01812 Encounter for preprocedural laboratory examination: Secondary | ICD-10-CM | POA: Insufficient documentation

## 2021-04-24 DIAGNOSIS — Z01818 Encounter for other preprocedural examination: Secondary | ICD-10-CM

## 2021-04-24 HISTORY — DX: Inflammatory liver disease, unspecified: K75.9

## 2021-04-24 HISTORY — DX: Cardiac arrhythmia, unspecified: I49.9

## 2021-04-24 LAB — TYPE AND SCREEN
ABO/RH(D): O POS
Antibody Screen: NEGATIVE

## 2021-04-24 LAB — COMPREHENSIVE METABOLIC PANEL
ALT: 100 U/L — ABNORMAL HIGH (ref 0–44)
AST: 106 U/L — ABNORMAL HIGH (ref 15–41)
Albumin: 3.5 g/dL (ref 3.5–5.0)
Alkaline Phosphatase: 50 U/L (ref 38–126)
Anion gap: 9 (ref 5–15)
BUN: 33 mg/dL — ABNORMAL HIGH (ref 8–23)
CO2: 27 mmol/L (ref 22–32)
Calcium: 9 mg/dL (ref 8.9–10.3)
Chloride: 105 mmol/L (ref 98–111)
Creatinine, Ser: 2.64 mg/dL — ABNORMAL HIGH (ref 0.61–1.24)
GFR, Estimated: 24 mL/min — ABNORMAL LOW (ref >60)
Glucose, Bld: 107 mg/dL — ABNORMAL HIGH (ref 70–99)
Potassium: 4.8 mmol/L (ref 3.5–5.1)
Sodium: 141 mmol/L (ref 135–145)
Total Bilirubin: 0.4 mg/dL (ref 0.3–1.2)
Total Protein: 6 g/dL — ABNORMAL LOW (ref 6.5–8.1)

## 2021-04-24 LAB — CBC
HCT: 32.9 % — ABNORMAL LOW (ref 39.0–52.0)
Hemoglobin: 10.5 g/dL — ABNORMAL LOW (ref 13.0–17.0)
MCH: 30.1 pg (ref 26.0–34.0)
MCHC: 31.9 g/dL (ref 30.0–36.0)
MCV: 94.3 fL (ref 80.0–100.0)
Platelets: 180 10*3/uL (ref 150–400)
RBC: 3.49 MIL/uL — ABNORMAL LOW (ref 4.22–5.81)
RDW: 15.3 % (ref 11.5–15.5)
WBC: 4.3 10*3/uL (ref 4.0–10.5)
nRBC: 0 % (ref 0.0–0.2)

## 2021-04-24 LAB — HEMOGLOBIN A1C
Hgb A1c MFr Bld: 5.2 % (ref 4.8–5.6)
Mean Plasma Glucose: 102.54 mg/dL

## 2021-04-24 LAB — SURGICAL PCR SCREEN
MRSA, PCR: NEGATIVE
Staphylococcus aureus: NEGATIVE

## 2021-04-24 LAB — GLUCOSE, CAPILLARY: Glucose-Capillary: 108 mg/dL — ABNORMAL HIGH (ref 70–99)

## 2021-04-24 LAB — SARS CORONAVIRUS 2 BY RT PCR (HOSPITAL ORDER, PERFORMED IN ~~LOC~~ HOSPITAL LAB): SARS Coronavirus 2: NEGATIVE

## 2021-04-24 NOTE — Progress Notes (Signed)
Abnormal labs in PAT - Dr. Lynann Bologna office was called and results were communicated to the surgical scheduler.

## 2021-04-24 NOTE — Progress Notes (Addendum)
PCP - Charlcie Cradle, MD Cardiologist - Nicanor Alcon, MD    PPM/ICD - denies Device Orders - n/a Rep Notified - n/a  Chest x-ray - n/a EKG - 02/01/2021 - CE - tracing requested Stress Test - 2019 ECHO - 2019 Cardiac Cath - denies  Sleep Study - yes - negative for OSA CPAP - n/a  Fasting Blood Sugar - patient is not checking CBG at home CBG today - 108 A1C - done in PAT on 04/24/2021  Blood Thinner Instructions: Eliquis - last dose - 04/23/2021 per patient  Aspirin Instructions: last dose - patient verbalized that he stopped Aspirin more than 2 weeks ago  Patient was instructed: As of today, STOP taking any Aspirin (unless otherwise instructed by your surgeon) Aleve, Naproxen, Ibuprofen, Motrin, Advil, Goody's, BC's, all herbal medications, fish oil, and all vitamins.  ERAS Protcol - yes PRE-SURGERY G2- until 04:30 o'clock  COVID TEST- done in PAT on 04/24/2021   Anesthesia review: yes - cardiac history; records requested from Dr. Clydie Braun office; abnormal labs in PAT (Dr. Lynann Bologna was notified)  Patient denies shortness of breath, fever, cough and chest pain at PAT appointment   All instructions explained to the patient, with a verbal understanding of the material. Patient agrees to go over the instructions while at home for a better understanding. Patient also instructed to self quarantine after being tested for COVID-19. The opportunity to ask questions was provided.

## 2021-04-25 NOTE — Progress Notes (Signed)
Anesthesia Chart Review:  Follows with vascular surgery at [facility] for history of PAD s/p right SFA stent and carotid disease with bilateral 60-79% stenosis.  Last seen 12/08/2020 with repeat imaging.  Per note, "Lower extremity imaging of the right lower extremity stent reveals his velocities to be stable at 234 cm/s in the SFA stent. ABIs are fairly stable at 0.67 on the right and 0.6 on the left. Carotid duplex scan is stable with a 60 to 79% stenosis of the bilateral internal carotid arteries. The brachial pressures are symmetric. The vertebrals are antegrade."  Recommended patient continue to follow-up yearly for repeat imaging.  Follows with cardiology at [facility] for history of A-fib s/p cardioversion 10/04/2020 (maintained on amiodarone and Eliquis), pulmonary hypertension, chronic DOE.  Recent nuclear stress 07/27/2020 was negative for ischemia.  Echo 06/17/2020 showed EF 65 to 70%, mild MR, mild to moderate pulmonary hypertension.  He was last seen by Dr. Clydie Braun 02/01/2021.  His carvedilol was reduced due to patient's persistent fatigue.  No other changes made to management.  Recommended follow-up in 6 months.  Cardiac clearance dated 2/23 states patient is low risk for surgery and may hold Eliquis 2 days prior.  Patient reports last dose Eliquis 04/23/2021.  Sleep study August 2022 was negative for OSA per pulmonology notes.  History of CKD 3/4 followed by [facility].  Non-insulin-dependent DM2, well controlled, 5.2 on preop labs.  Preop labs reviewed, creatinine elevated at 2.64 consistent with history of CKD (recent baseline creatinine ~2.6 per labs in Care Everywhere).  Mild anemia with hemoglobin 10.5.  Mild transaminitis with AST 106 and ALT 100; review of labs in care everywhere shows that this is near baseline for patient, he has chronic mild elevations.  EKG 02/01/2021 (copy on chart): Sinus rhythm with occasional PVCs.  Rate 64.  Nuclear stress 07/27/2020 (Care  Everywhere): IMPRESSION:  1.  No significant evidence of reversible ischemia on the study.   2.  Calculated ejection fraction 66%.    3.  Normal significant evidence of reversible ischemia on the study.    There is mild diaphragmatic attenuation noted.  There is a preserved  ejection fraction.    TTE 06/17/2020 (Care Everywhere): SUMMARY  Left ventricular systolic function is normal.  LV ejection fraction = 65-70%.  There is normal left ventricular wall thickness.  There is mild mitral regurgitation.  There is trace tricuspid regurgitation.  Mild to moderate pulmonary hypertension.  There is no pericardial effusion.  There is no comparison study available.    Wynonia Musty [facility] Short Stay Center/Anesthesiology Phone [phone] 04/26/2021 9:36 AM

## 2021-04-26 NOTE — Anesthesia Preprocedure Evaluation (Addendum)
Anesthesia Evaluation  ?Patient identified by MRN, date of birth, ID band ?Patient awake ? ? ? ?Reviewed: ?Allergy & Precautions, H&P , NPO status , Patient's Chart, lab work & pertinent test results ? ?Airway ?Mallampati: II ? ?TM Distance: >3 FB ?Neck ROM: Full ? ? ? Dental ?no notable dental hx. ?(+) Edentulous Upper, Edentulous Lower, Dental Advisory Given ?  ?Pulmonary ?neg pulmonary ROS, former smoker,  ?  ?Pulmonary exam normal ?breath sounds clear to auscultation ? ? ? ? ? ? Cardiovascular ?Exercise Tolerance: Good ?hypertension, Pt. on medications and Pt. on home beta blockers ?+ Peripheral Vascular Disease  ?+ dysrhythmias Atrial Fibrillation  ?Rhythm:Regular Rate:Normal ? ? ?  ?Neuro/Psych ?Anxiety negative neurological ROS ?   ? GI/Hepatic ?GERD  Medicated,(+) Hepatitis -  ?Endo/Other  ?diabetes, Type 2, Oral Hypoglycemic AgentsHypothyroidism  ? Renal/GU ?Renal InsufficiencyRenal disease  ?negative genitourinary ?  ?Musculoskeletal ? ?(+) Arthritis , Osteoarthritis,   ? Abdominal ?  ?Peds ? Hematology ? ?(+) Blood dyscrasia, anemia ,   ?Anesthesia Other Findings ? ? Reproductive/Obstetrics ?negative OB ROS ? ?  ? ? ? ? ? ? ? ? ? ? ? ? ? ?  ?  ? ? ? ? ? ? ?Anesthesia Physical ?Anesthesia Plan ? ?ASA: 3 ? ?Anesthesia Plan: General  ? ?Post-op Pain Management:   ? ?Induction: Intravenous ? ?PONV Risk Score and Plan: 3 and Ondansetron, Dexamethasone and Treatment may vary due to age or medical condition ? ?Airway Management Planned: Oral ETT ? ?Additional Equipment:  ? ?Intra-op Plan:  ? ?Post-operative Plan: Extubation in OR ? ?Informed Consent: I have reviewed the patients History and Physical, chart, labs and discussed the procedure including the risks, benefits and alternatives for the proposed anesthesia with the patient or authorized representative who has indicated his/her understanding and acceptance.  ? ? ? ?Dental advisory given ? ?Plan Discussed with:  CRNA ? ?Anesthesia Plan Comments: (PAT note by Karoline Caldwell, PA-C: ?Follows with vascular surgery at [facility] for history of PAD s/p right SFA stent and carotid disease with bilateral 60-79% stenosis.  Last seen 12/08/2020 with repeat imaging.  Per note, "Lower extremity imaging of the right lower extremity stent reveals his velocities to be stable at 234 cm/s in the SFA stent. ABIs are fairly stable at 0.67 on the right and 0.6 on the left. Carotid duplex scan is stable with a 60 to 79% stenosis of the bilateral internal carotid arteries. The brachial pressures are symmetric. The vertebrals are antegrade."  Recommended patient continue to follow-up yearly for repeat imaging. ? ?Follows with cardiology at [facility] for history of A-fib s/p cardioversion 10/04/2020 (maintained on amiodarone and Eliquis), pulmonary hypertension, chronic DOE.  Recent nuclear stress 07/27/2020 was negative for ischemia.  Echo 06/17/2020 showed EF 65 to 70%, mild MR, mild to moderate pulmonary hypertension.  He was last seen by Dr. Clydie Braun 02/01/2021.  His carvedilol was reduced due to patient's persistent fatigue.  No other changes made to management.  Recommended follow-up in 6 months.  Cardiac clearance dated 2/23 states patient is low risk for surgery and may hold Eliquis 2 days prior. ? ?Patient reports last dose Eliquis 04/23/2021. ? ?Sleep study August 2022 was negative for OSA per pulmonology notes. ? ?History of CKD 3/4 followed by [facility]. ? ?Non-insulin-dependent DM2, well controlled, 5.2 on preop labs. ? ?Preop labs reviewed, creatinine elevated at 2.64 consistent with history of CKD (recent baseline creatinine ~2.6 per labs in Care Everywhere).  Mild anemia with hemoglobin 10.5.  Mild transaminitis with AST 106 and ALT 100; review of labs in care everywhere shows that this is near baseline for patient, he has chronic mild elevations. ? ?EKG 02/01/2021 (copy on chart): Sinus rhythm with occasional PVCs.  Rate  64. ? ?Nuclear stress 07/27/2020 (Care Everywhere): ?IMPRESSION:  ?1. ?No significant evidence of reversible ischemia on the study.  ? ?2. ?Calculated?ejection fraction 66%.  ? ? ?3. ?Normal significant evidence of reversible ischemia on the study. ?  ?There is mild diaphragmatic attenuation noted. ?There is a preserved  ?ejection fraction. ? ? ?TTE 06/17/2020 (Care Everywhere): ?SUMMARY  ?Left ventricular systolic function is normal.  ?LV ejection fraction = 65-70%.  ?There is normal left ventricular wall thickness.  ?There is mild mitral regurgitation.  ?There is trace tricuspid regurgitation.  ?Mild to moderate pulmonary hypertension.  ?There is no pericardial effusion.  ?There is no comparison study available.  ? ?)  ? ? ? ? ? ?Anesthesia Quick Evaluation ? ?

## 2021-04-27 ENCOUNTER — Other Ambulatory Visit: Payer: Self-pay

## 2021-04-27 ENCOUNTER — Inpatient Hospital Stay (HOSPITAL_COMMUNITY): Payer: Medicare HMO | Admitting: Anesthesiology

## 2021-04-27 ENCOUNTER — Encounter (HOSPITAL_COMMUNITY): Payer: Self-pay | Admitting: Orthopedic Surgery

## 2021-04-27 ENCOUNTER — Inpatient Hospital Stay (HOSPITAL_COMMUNITY): Payer: Medicare HMO

## 2021-04-27 ENCOUNTER — Inpatient Hospital Stay (HOSPITAL_COMMUNITY)
Admission: RE | Disposition: A | Discharge status: Home / Self Care | Payer: Self-pay | Source: Home / Self Care | Attending: Orthopedic Surgery

## 2021-04-27 ENCOUNTER — Inpatient Hospital Stay (HOSPITAL_COMMUNITY): Payer: Medicare HMO | Admitting: Physician Assistant

## 2021-04-27 ENCOUNTER — Inpatient Hospital Stay (HOSPITAL_COMMUNITY)
Admission: RE | Admit: 2021-04-27 | Discharge: 2021-04-27 | DRG: 472 | Disposition: A | Discharge status: Home / Self Care | Payer: Medicare HMO | Attending: Orthopedic Surgery | Admitting: Orthopedic Surgery

## 2021-04-27 DIAGNOSIS — I10 Essential (primary) hypertension: Secondary | ICD-10-CM | POA: Diagnosis not present

## 2021-04-27 DIAGNOSIS — K219 Gastro-esophageal reflux disease without esophagitis: Secondary | ICD-10-CM | POA: Diagnosis present

## 2021-04-27 DIAGNOSIS — N184 Chronic kidney disease, stage 4 (severe): Secondary | ICD-10-CM | POA: Diagnosis present

## 2021-04-27 DIAGNOSIS — M5412 Radiculopathy, cervical region: Secondary | ICD-10-CM | POA: Diagnosis present

## 2021-04-27 DIAGNOSIS — E1151 Type 2 diabetes mellitus with diabetic peripheral angiopathy without gangrene: Secondary | ICD-10-CM | POA: Diagnosis present

## 2021-04-27 DIAGNOSIS — Z20822 Contact with and (suspected) exposure to covid-19: Secondary | ICD-10-CM | POA: Diagnosis present

## 2021-04-27 DIAGNOSIS — I70201 Unspecified atherosclerosis of native arteries of extremities, right leg: Secondary | ICD-10-CM | POA: Diagnosis present

## 2021-04-27 DIAGNOSIS — E7800 Pure hypercholesterolemia, unspecified: Secondary | ICD-10-CM | POA: Diagnosis present

## 2021-04-27 DIAGNOSIS — Z79899 Other long term (current) drug therapy: Secondary | ICD-10-CM

## 2021-04-27 DIAGNOSIS — G992 Myelopathy in diseases classified elsewhere: Secondary | ICD-10-CM | POA: Diagnosis present

## 2021-04-27 DIAGNOSIS — M4802 Spinal stenosis, cervical region: Principal | ICD-10-CM | POA: Diagnosis present

## 2021-04-27 DIAGNOSIS — Z419 Encounter for procedure for purposes other than remedying health state, unspecified: Secondary | ICD-10-CM

## 2021-04-27 DIAGNOSIS — Z79891 Long term (current) use of opiate analgesic: Secondary | ICD-10-CM

## 2021-04-27 DIAGNOSIS — G2581 Restless legs syndrome: Secondary | ICD-10-CM | POA: Diagnosis present

## 2021-04-27 DIAGNOSIS — E291 Testicular hypofunction: Secondary | ICD-10-CM | POA: Diagnosis present

## 2021-04-27 DIAGNOSIS — E039 Hypothyroidism, unspecified: Secondary | ICD-10-CM | POA: Diagnosis present

## 2021-04-27 DIAGNOSIS — E1122 Type 2 diabetes mellitus with diabetic chronic kidney disease: Secondary | ICD-10-CM | POA: Diagnosis present

## 2021-04-27 DIAGNOSIS — Z91041 Radiographic dye allergy status: Secondary | ICD-10-CM

## 2021-04-27 DIAGNOSIS — Z87891 Personal history of nicotine dependence: Secondary | ICD-10-CM

## 2021-04-27 DIAGNOSIS — Z7901 Long term (current) use of anticoagulants: Secondary | ICD-10-CM

## 2021-04-27 DIAGNOSIS — Z981 Arthrodesis status: Secondary | ICD-10-CM

## 2021-04-27 DIAGNOSIS — Z87442 Personal history of urinary calculi: Secondary | ICD-10-CM | POA: Diagnosis not present

## 2021-04-27 DIAGNOSIS — Z79890 Hormone replacement therapy (postmenopausal): Secondary | ICD-10-CM

## 2021-04-27 DIAGNOSIS — Z885 Allergy status to narcotic agent status: Secondary | ICD-10-CM

## 2021-04-27 DIAGNOSIS — Z95820 Peripheral vascular angioplasty status with implants and grafts: Secondary | ICD-10-CM

## 2021-04-27 DIAGNOSIS — M1990 Unspecified osteoarthritis, unspecified site: Secondary | ICD-10-CM | POA: Diagnosis present

## 2021-04-27 DIAGNOSIS — N400 Enlarged prostate without lower urinary tract symptoms: Secondary | ICD-10-CM | POA: Diagnosis present

## 2021-04-27 DIAGNOSIS — Z7982 Long term (current) use of aspirin: Secondary | ICD-10-CM

## 2021-04-27 DIAGNOSIS — I6523 Occlusion and stenosis of bilateral carotid arteries: Secondary | ICD-10-CM | POA: Diagnosis present

## 2021-04-27 DIAGNOSIS — G549 Nerve root and plexus disorder, unspecified: Secondary | ICD-10-CM | POA: Diagnosis present

## 2021-04-27 DIAGNOSIS — Z888 Allergy status to other drugs, medicaments and biological substances status: Secondary | ICD-10-CM | POA: Diagnosis not present

## 2021-04-27 DIAGNOSIS — I129 Hypertensive chronic kidney disease with stage 1 through stage 4 chronic kidney disease, or unspecified chronic kidney disease: Secondary | ICD-10-CM | POA: Diagnosis present

## 2021-04-27 DIAGNOSIS — G959 Disease of spinal cord, unspecified: Secondary | ICD-10-CM | POA: Diagnosis not present

## 2021-04-27 DIAGNOSIS — Z7984 Long term (current) use of oral hypoglycemic drugs: Secondary | ICD-10-CM

## 2021-04-27 HISTORY — PX: ANTERIOR CERVICAL DECOMPRESSION/DISCECTOMY FUSION 4 LEVELS: SHX5556

## 2021-04-27 LAB — GLUCOSE, CAPILLARY
Glucose-Capillary: 82 mg/dL (ref 70–99)
Glucose-Capillary: 124 mg/dL — ABNORMAL HIGH (ref 70–99)

## 2021-04-27 SURGERY — ANTERIOR CERVICAL DECOMPRESSION/DISCECTOMY FUSION 4 LEVELS
Anesthesia: General

## 2021-04-27 MED ORDER — MIDAZOLAM HCL 2 MG/2ML IJ SOLN
INTRAMUSCULAR | Status: AC
  Filled 2021-04-27: qty 2

## 2021-04-27 MED ORDER — PROPOFOL 10 MG/ML IV BOLUS
INTRAVENOUS | Status: AC
  Filled 2021-04-27: qty 20

## 2021-04-27 MED ORDER — EPHEDRINE SULFATE (PRESSORS) 50 MG/ML IJ SOLN
INTRAMUSCULAR | Status: DC | PRN
  Administered 2021-04-27 (×2): 10 mg via INTRAVENOUS
  Administered 2021-04-27: 5 mg via INTRAVENOUS

## 2021-04-27 MED ORDER — LACTATED RINGERS IV SOLN: INTRAVENOUS | Status: DC

## 2021-04-27 MED ORDER — CHLORHEXIDINE GLUCONATE 0.12 % MT SOLN
15.0000 mL | Freq: Once | OROMUCOSAL | Status: AC
  Administered 2021-04-27: 15 mL via OROMUCOSAL
  Filled 2021-04-27: qty 15

## 2021-04-27 MED ORDER — HYDROMORPHONE HCL 1 MG/ML IJ SOLN
INTRAMUSCULAR | Status: AC
  Filled 2021-04-27: qty 1

## 2021-04-27 MED ORDER — BUPIVACAINE-EPINEPHRINE 0.25% -1:200000 IJ SOLN
INTRAMUSCULAR | Status: DC | PRN
  Administered 2021-04-27: 5 mL

## 2021-04-27 MED ORDER — HYDROMORPHONE HCL 1 MG/ML IJ SOLN
0.2500 mg | INTRAMUSCULAR | Status: DC | PRN
  Administered 2021-04-27 (×4): 0.5 mg via INTRAVENOUS

## 2021-04-27 MED ORDER — ORAL CARE MOUTH RINSE: 15.0000 mL | Freq: Once | OROMUCOSAL | Status: AC

## 2021-04-27 MED ORDER — ROCURONIUM BROMIDE 10 MG/ML (PF) SYRINGE
PREFILLED_SYRINGE | INTRAVENOUS | Status: DC | PRN
  Administered 2021-04-27: 20 mg via INTRAVENOUS
  Administered 2021-04-27: 70 mg via INTRAVENOUS

## 2021-04-27 MED ORDER — METHOCARBAMOL 500 MG PO TABS: 500.0000 mg | ORAL_TABLET | Freq: Four times a day (QID) | ORAL | 0 refills | Status: AC | PRN

## 2021-04-27 MED ORDER — THROMBIN 20000 UNITS EX SOLR
CUTANEOUS | Status: DC | PRN
  Administered 2021-04-27: 20000 [IU] via TOPICAL

## 2021-04-27 MED ORDER — ONDANSETRON HCL 4 MG/2ML IJ SOLN
INTRAMUSCULAR | Status: AC
  Filled 2021-04-27: qty 2

## 2021-04-27 MED ORDER — SUGAMMADEX SODIUM 200 MG/2ML IV SOLN
INTRAVENOUS | Status: DC | PRN
  Administered 2021-04-27: 200 mg via INTRAVENOUS

## 2021-04-27 MED ORDER — INSULIN ASPART 100 UNIT/ML IJ SOLN: 0.0000 [IU] | INTRAMUSCULAR | Status: DC | PRN

## 2021-04-27 MED ORDER — HYDROMORPHONE HCL 1 MG/ML IJ SOLN
INTRAMUSCULAR | Status: AC
  Filled 2021-04-27: qty 0.5

## 2021-04-27 MED ORDER — THROMBIN 20000 UNITS EX KIT
PACK | CUTANEOUS | Status: AC
  Filled 2021-04-27: qty 1

## 2021-04-27 MED ORDER — ACETAMINOPHEN 500 MG PO TABS
ORAL_TABLET | ORAL | Status: AC
  Filled 2021-04-27: qty 2

## 2021-04-27 MED ORDER — ONDANSETRON HCL 4 MG/2ML IJ SOLN
INTRAMUSCULAR | Status: DC | PRN
  Administered 2021-04-27: 4 mg via INTRAVENOUS

## 2021-04-27 MED ORDER — ACETAMINOPHEN 10 MG/ML IV SOLN
INTRAVENOUS | Status: AC
  Filled 2021-04-27: qty 100

## 2021-04-27 MED ORDER — BUPIVACAINE-EPINEPHRINE (PF) 0.25% -1:200000 IJ SOLN
INTRAMUSCULAR | Status: AC
  Filled 2021-04-27: qty 30

## 2021-04-27 MED ORDER — MIDAZOLAM HCL 2 MG/2ML IJ SOLN
INTRAMUSCULAR | Status: DC | PRN
  Administered 2021-04-27: 1 mg via INTRAVENOUS

## 2021-04-27 MED ORDER — LIDOCAINE 2% (20 MG/ML) 5 ML SYRINGE
INTRAMUSCULAR | Status: DC | PRN
  Administered 2021-04-27: 60 mg via INTRAVENOUS

## 2021-04-27 MED ORDER — PHENYLEPHRINE HCL-NACL 20-0.9 MG/250ML-% IV SOLN
INTRAVENOUS | Status: DC | PRN
  Administered 2021-04-27: 15 ug/min via INTRAVENOUS

## 2021-04-27 MED ORDER — PROPOFOL 10 MG/ML IV BOLUS
INTRAVENOUS | Status: DC | PRN
  Administered 2021-04-27: 140 mg via INTRAVENOUS

## 2021-04-27 MED ORDER — DEXAMETHASONE SODIUM PHOSPHATE 10 MG/ML IJ SOLN
INTRAMUSCULAR | Status: DC | PRN
  Administered 2021-04-27: 5 mg via INTRAVENOUS

## 2021-04-27 MED ORDER — FENTANYL CITRATE (PF) 250 MCG/5ML IJ SOLN
INTRAMUSCULAR | Status: DC | PRN
Start: 2021-04-27 — End: 2021-04-27
  Administered 2021-04-27 (×2): 25 ug via INTRAVENOUS
  Administered 2021-04-27: 50 ug via INTRAVENOUS
  Administered 2021-04-27: 25 ug via INTRAVENOUS
  Administered 2021-04-27: 75 ug via INTRAVENOUS
  Administered 2021-04-27: 50 ug via INTRAVENOUS

## 2021-04-27 MED ORDER — FENTANYL CITRATE (PF) 250 MCG/5ML IJ SOLN
INTRAMUSCULAR | Status: AC
Start: 2021-04-27 — End: ?
  Filled 2021-04-27: qty 5

## 2021-04-27 MED ORDER — PHENYLEPHRINE HCL (PRESSORS) 10 MG/ML IV SOLN
INTRAVENOUS | Status: DC | PRN
  Administered 2021-04-27: 80 ug via INTRAVENOUS

## 2021-04-27 MED ORDER — HYDROMORPHONE HCL 1 MG/ML IJ SOLN
INTRAMUSCULAR | Status: DC | PRN
  Administered 2021-04-27: .5 mg via INTRAVENOUS

## 2021-04-27 MED ORDER — CEFAZOLIN SODIUM-DEXTROSE 2-4 GM/100ML-% IV SOLN
2.0000 g | INTRAVENOUS | Status: AC
  Administered 2021-04-27: 2 g via INTRAVENOUS
  Filled 2021-04-27: qty 100

## 2021-04-27 MED ORDER — 0.9 % SODIUM CHLORIDE (POUR BTL) OPTIME
TOPICAL | Status: DC | PRN
  Administered 2021-04-27: 1000 mL

## 2021-04-27 SURGICAL SUPPLY — 75 items
BAG COUNTER SPONGE SURGICOUNT (BAG) ×2 IMPLANT
BENZOIN TINCTURE PRP APPL 2/3 (GAUZE/BANDAGES/DRESSINGS) ×2 IMPLANT
BIT DRILL NEURO 2X3.1 SFT TUCH (MISCELLANEOUS) ×1 IMPLANT
BIT DRILL SRG 14X2.2XFLT CHK (BIT) IMPLANT
BIT DRL SRG 14X2.2XFLT CHK (BIT) ×1
BLADE CLIPPER SURG (BLADE) ×2 IMPLANT
BLADE SURG 15 STRL LF DISP TIS (BLADE) ×1 IMPLANT
BLADE SURG 15 STRL SS (BLADE) ×1
BONE VIVIGEN FORMABLE 1.3CC (Bone Implant) ×6 IMPLANT
BUR MATCHSTICK NEURO 3.0 LAGG (BURR) IMPLANT
CARTRIDGE OIL MAESTRO DRILL (MISCELLANEOUS) ×1 IMPLANT
CLSR STERI-STRIP ANTIMIC 1/2X4 (GAUZE/BANDAGES/DRESSINGS) ×1 IMPLANT
COVER SURGICAL LIGHT HANDLE (MISCELLANEOUS) ×2 IMPLANT
DECANTER SPIKE VIAL GLASS SM (MISCELLANEOUS) ×2 IMPLANT
DEVICE ENDSKLTN TC MED 8MM (Orthopedic Implant) IMPLANT
DIFFUSER DRILL AIR PNEUMATIC (MISCELLANEOUS) ×2 IMPLANT
DRAIN JACKSON RD 7FR 3/32 (WOUND CARE) IMPLANT
DRAPE C-ARM 42X72 X-RAY (DRAPES) ×2 IMPLANT
DRAPE POUCH INSTRU U-SHP 10X18 (DRAPES) ×2 IMPLANT
DRAPE SURG 17X23 STRL (DRAPES) ×6 IMPLANT
DRILL BIT SKYLINE 14MM (BIT) ×1
DRILL NEURO 2X3.1 SOFT TOUCH (MISCELLANEOUS) ×2
DURAPREP 26ML APPLICATOR (WOUND CARE) ×2 IMPLANT
ELECT COATED BLADE 2.86 ST (ELECTRODE) ×2 IMPLANT
ELECT REM PT RETURN 9FT ADLT (ELECTROSURGICAL) ×2
ELECTRODE REM PT RTRN 9FT ADLT (ELECTROSURGICAL) ×1 IMPLANT
ENDOSKELTON TC IMPLANT 8MM MED (Orthopedic Implant) ×6 IMPLANT
EVACUATOR SILICONE 100CC (DRAIN) IMPLANT
GAUZE 4X4 16PLY ~~LOC~~+RFID DBL (SPONGE) ×2 IMPLANT
GAUZE SPONGE 4X4 12PLY STRL (GAUZE/BANDAGES/DRESSINGS) ×2 IMPLANT
GAUZE SPONGE 4X4 12PLY STRL LF (GAUZE/BANDAGES/DRESSINGS) ×1 IMPLANT
GLOVE SRG 8 PF TXTR STRL LF DI (GLOVE) ×1 IMPLANT
GLOVE SURG ENC MOIS LTX SZ6.5 (GLOVE) ×2 IMPLANT
GLOVE SURG ENC MOIS LTX SZ8 (GLOVE) ×2 IMPLANT
GLOVE SURG UNDER POLY LF SZ7 (GLOVE) ×4 IMPLANT
GLOVE SURG UNDER POLY LF SZ8 (GLOVE) ×1
GOWN STRL REUS W/ TWL LRG LVL3 (GOWN DISPOSABLE) ×1 IMPLANT
GOWN STRL REUS W/ TWL XL LVL3 (GOWN DISPOSABLE) ×1 IMPLANT
GOWN STRL REUS W/TWL LRG LVL3 (GOWN DISPOSABLE) ×1
GOWN STRL REUS W/TWL XL LVL3 (GOWN DISPOSABLE) ×1
GRAFT BNE MATRIX VG FRMBL SM 1 (Bone Implant) IMPLANT
IV CATH 14GX2 1/4 (CATHETERS) ×2 IMPLANT
KIT BASIN OR (CUSTOM PROCEDURE TRAY) ×2 IMPLANT
KIT TURNOVER KIT B (KITS) ×2 IMPLANT
MANIFOLD NEPTUNE II (INSTRUMENTS) ×2 IMPLANT
NDL PRECISIONGLIDE 27X1.5 (NEEDLE) ×1 IMPLANT
NDL SPNL 20GX3.5 QUINCKE YW (NEEDLE) ×1 IMPLANT
NEEDLE PRECISIONGLIDE 27X1.5 (NEEDLE) ×2 IMPLANT
NEEDLE SPNL 20GX3.5 QUINCKE YW (NEEDLE) ×2 IMPLANT
NS IRRIG 1000ML POUR BTL (IV SOLUTION) ×2 IMPLANT
OIL CARTRIDGE MAESTRO DRILL (MISCELLANEOUS) ×2
PACK ORTHO CERVICAL (CUSTOM PROCEDURE TRAY) ×2 IMPLANT
PAD ARMBOARD 7.5X6 YLW CONV (MISCELLANEOUS) ×4 IMPLANT
PATTIES SURGICAL .5 X.5 (GAUZE/BANDAGES/DRESSINGS) IMPLANT
PATTIES SURGICAL .5 X1 (DISPOSABLE) IMPLANT
PIN DISTRACTION 14 (PIN) ×3 IMPLANT
PLATE SKYLINE 3LVL 57M SPINE (Plate) ×1 IMPLANT
POSITIONER HEAD DONUT 9IN (MISCELLANEOUS) ×2 IMPLANT
SCREW SKYLINE VAR OS 14MM (Screw) ×8 IMPLANT
SPONGE INTESTINAL PEANUT (DISPOSABLE) ×4 IMPLANT
SPONGE SURGIFOAM ABS GEL 100 (HEMOSTASIS) ×2 IMPLANT
STRIP CLOSURE SKIN 1/2X4 (GAUZE/BANDAGES/DRESSINGS) ×2 IMPLANT
SURGIFLO W/THROMBIN 8M KIT (HEMOSTASIS) IMPLANT
SUT MNCRL AB 4-0 PS2 18 (SUTURE) ×2 IMPLANT
SUT VIC AB 2-0 CT2 18 VCP726D (SUTURE) ×2 IMPLANT
SYR BULB IRRIG 60ML STRL (SYRINGE) ×2 IMPLANT
SYR CONTROL 10ML LL (SYRINGE) ×4 IMPLANT
TAPE CLOTH 4X10 WHT NS (GAUZE/BANDAGES/DRESSINGS) ×2 IMPLANT
TAPE CLOTH SURG 4X10 WHT LF (GAUZE/BANDAGES/DRESSINGS) ×1 IMPLANT
TAPE UMBILICAL COTTON 1/8X30 (MISCELLANEOUS) ×2 IMPLANT
TOWEL GREEN STERILE (TOWEL DISPOSABLE) ×2 IMPLANT
TOWEL GREEN STERILE FF (TOWEL DISPOSABLE) ×2 IMPLANT
TRAY FOLEY MTR SLVR 16FR STAT (SET/KITS/TRAYS/PACK) ×2 IMPLANT
WATER STERILE IRR 1000ML POUR (IV SOLUTION) ×2 IMPLANT
YANKAUER SUCT BULB TIP NO VENT (SUCTIONS) ×2 IMPLANT

## 2021-04-27 NOTE — Progress Notes (Signed)
Pt ambulated in hall with standby assist; pt tolerated well, gait steady; pt tolerating PO, voiding, and pain is tolerable; pt discharged per MD ? ?

## 2021-04-27 NOTE — Anesthesia Procedure Notes (Signed)
Procedure Name: Intubation ?Date/Time: 04/27/2021 7:51 AM ?Performed by: Clearnce Sorrel, CRNA ?Pre-anesthesia Checklist: Patient identified, Emergency Drugs available, Suction available and Patient being monitored ?Patient Re-evaluated:Patient Re-evaluated prior to induction ?Oxygen Delivery Method: Circle System Utilized ?Preoxygenation: Pre-oxygenation with 100% oxygen ?Induction Type: IV induction ?Ventilation: Mask ventilation without difficulty ?Laryngoscope Size: Glidescope and 4 (Limited neck ROM) ?Grade View: Grade I ?Tube type: Oral ?Tube size: 7.5 mm ?Number of attempts: 1 ?Airway Equipment and Method: Stylet and Oral airway ?Placement Confirmation: ETT inserted through vocal cords under direct vision, positive ETCO2 and breath sounds checked- equal and bilateral ?Secured at: 23 cm ?Tube secured with: Tape ?Dental Injury: Teeth and Oropharynx as per pre-operative assessment  ? ? ? ? ?

## 2021-04-27 NOTE — Op Note (Signed)
PATIENT NAME: Adam Jones  ? ?MEDICAL RECORD NO.:   [id number]  ?  ?DATE OF BIRTH: [birth year] ?  ?DATE OF PROCEDURE: 04/27/2021  ?  ?                            OPERATIVE REPORT ?  ?  ?PREOPERATIVE DIAGNOSES: ?1.  Progressive cervical myelopathy ?2.  Spinal stenosis spanning C3-C6 ?  ?POSTOPERATIVE DIAGNOSES: ?1.  Progressive cervical myelopathy ?2.  Spinal stenosis spanning C3-C6 ?  ?PROCEDURE: ?1. Anterior cervical decompression and fusion C3/4, C4/5, C5/6 ?2. Placement of anterior instrumentation, C3-C6 ?3. Insertion of interbody device x 3 (72mm Titan intervertebral spacers). ?4. Intraoperative use of fluoroscopy. ?5. Use of morselized allograft - ViviGen. ?  ?SURGEON:  Phylliss Bob, MD ?  ?ASSISTANT:  Pricilla Holm, PA-C. ?  ?ANESTHESIA:  General endotracheal anesthesia. ?  ?COMPLICATIONS:  None. ?  ?DISPOSITION:  Stable. ?  ?ESTIMATED BLOOD LOSS:  Minimal. ?  ?INDICATIONS FOR SURGERY:  Briefly, [name] is a pleasant 76 y.o. year-old male, who did present to me with progressive cervical myelopathy.  The patient's MRI did reveal spinal cord compression, as noted above.  Given the patient's progressive symptoms and MRI findings, we did discuss proceeding with the ?procedure noted above.  The patient was fully aware of the risks and ?limitations of surgery as outlined in my preoperative note. ?  ?OPERATIVE DETAILS:  On 04/27/2021, the patient was brought to ?surgery and general endotracheal anesthesia was administered.  The ?patient was placed supine on the hospital bed. The neck was gently ?extended.  All bony prominences were meticulously padded.  The neck was ?prepped and draped in the usual sterile fashion.  At this point, I did ?make a left-sided transverse incision.  The platysma was incised.  A ?Smith-Robinson approach was used and the anterior spine was identified. ?A self-retaining retractor was placed.  I then subperiosteally exposed ?the vertebral bodies from C3-C6.  Caspar pins were then placed into  the ?C5 and C6 vertebral bodies and distraction was applied.  A thorough and ?complete C5/6 intervertebral diskectomy was performed.  The posterior ?longitudinal ligament was identified and entered using a nerve hook.  I ?then used #1 followed by #2 Kerrison to perform a thorough and complete ?intervertebral diskectomy.  The spinal canal was thoroughly ?decompressed, as was the right and left neuroforamen.  The endplates were then ?prepared and the appropriate-sized intervertebral spacer was then packed ?with ViviGen and tamped into position in the usual fashion.  The lower ?Caspar pin was then removed and placed into the C4 vertebral body and ?once again, distraction was applied across the C4/5 intervertebral ?space.  I then again performed a thorough and complete diskectomy, ?thoroughly decompressing the spinal canal and bilateral neuroforamena.  After ?preparing the endplates, the appropriate-sized intervertebral spacer was ?packed with ViviGen and tamped into position.  The lower ?Caspar pin was then removed and placed into the C3 vertebral body and ?once again, distraction was applied across the C3/4 intervertebral ?space.  I then again performed a thorough and complete diskectomy, ?thoroughly decompressing the spinal canal and bilateral neuroforamena.  After ?preparing the endplates, the appropriate-sized intervertebral spacer was ?packed with ViviGen and tamped into position.  The Caspar pins then were ?removed and bone wax was placed in their place.  The appropriate-sized ?anterior cervical plate was placed over the anterior spine.  14 mm ?variable angle screws were placed, 2 in each vertebral body from C3-C6 ?  for a total of 8 vertebral body screws.  The screws were then locked to ?the plate using the Cam locking mechanism.  I was very pleased with the ?final fluoroscopic images.  The wound was then irrigated.  The wound was ?then explored for any undue bleeding and there was no bleeding noted. ?The wound  was then closed in layers using 2-0 Vicryl, followed by 4-0 ?Monocryl.  Benzoin and Steri-Strips were applied, followed by sterile ?dressing.  All instrument counts were correct at the termination of the ?procedure. ?  ?Of note, Pricilla Holm, PA-C, was my assistant throughout surgery, and did aid in retraction, suctioning, placement of the hardware, and closure from start to finish. ?  ?  ?  ?Phylliss Bob, MD  ?

## 2021-04-27 NOTE — H&P (Signed)
? ? ? ?PREOPERATIVE H&P ? ?Chief Complaint: Progressive deterioration in balance ? ?HPI: ?Adam Jones is a 76 y.o. male who presents with  progressive deterioration in balance. MRI shows cord compression spanning C3-C6 ? ?Patient has failed multiple forms of conservative care and continues to have pain (see office notes for additional details regarding the patient's full course of treatment) ? ?Past Medical History:  ?Diagnosis Date  ? Anemia   ? Anxiety   ? BPH (benign prostatic hyperplasia)   ? Carotid artery stenosis   ? Cataracts, bilateral   ? Chronic kidney disease   ? Diabetes mellitus without complication ([place])   ? Dysrhythmia   ? GERD (gastroesophageal reflux disease)   ? Hepatitis   ? History of kidney stones   ? Hypercholesteremia   ? Hypertension   ? Hypogonadism in male   ? Hypothyroidism   ? Low back pain   ? Lumbar radiculopathy   ? Osteoarthritis   ? Peripheral vascular disease ([place])   ? Pneumonia   ? RLS (restless legs syndrome)   ? ?Past Surgical History:  ?Procedure Laterality Date  ? ANTERIOR LAT LUMBAR FUSION Left 02/24/2020  ? Procedure: LEFT LATERAL LUMBAR 3-LUMBAR 4 INTERBODY FUSION WITH INSTRUMENTATION AND ALLOGRAFT;  Surgeon: Phylliss Bob, MD;  Location: [facility];  Service: Orthopedics;  Laterality: Left;  3 C BED  ? BACK SURGERY    ? x4  ? EXTRACORPOREAL SHOCK WAVE LITHOTRIPSY    ? KIDNEY STONE SURGERY    ? PENILE PROSTHESIS IMPLANT    ? ?Social History  ? ?Socioeconomic History  ? Marital status: Married  ?  Spouse name: Not on file  ? Number of children: Not on file  ? Years of education: Not on file  ? Highest education level: Not on file  ?Occupational History  ? Not on file  ?Tobacco Use  ? Smoking status: Former  ? Smokeless tobacco: Never  ? Tobacco comments:  ?  02/17/20 - "haven't smoked in 24 years"  ?Vaping Use  ? Vaping Use: Never used  ?Substance and Sexual Activity  ? Alcohol use: Not Currently  ? Drug use: Yes  ?  Types: Benzodiazepines  ? Sexual activity: Not on file   ?Other Topics Concern  ? Not on file  ?Social History Narrative  ? Not on file  ? ?Social Determinants of Health  ? ?Financial Resource Strain: Not on file  ?Food Insecurity: Not on file  ?Transportation Needs: Not on file  ?Physical Activity: Not on file  ?Stress: Not on file  ?Social Connections: Not on file  ? ?History reviewed. No pertinent family history. ?Allergies  ?Allergen Reactions  ? Levothyroxine   ?  Kept him awake ?  ? Metformin Other (See Comments)  ?  Lactic acidosis  ? Venlafaxine Nausea Only  ? Zolpidem Diarrhea  ? Diclofenac Sodium Nausea And Vomiting  ? Hydrocodone Itching  ?   ?  ? Iodinated Contrast Media Hives and Rash  ? Iodine Rash  ?  Betadine is ok no reaction to that  ? ?Prior to Admission medications   ?Medication Sig Start Date End Date Taking? Authorizing Provider  ?allopurinol (ZYLOPRIM) 100 MG tablet Take 100 mg by mouth daily.   Yes [provider]  ?amiodarone (PACERONE) 200 MG tablet Take 200 mg by mouth daily. 03/14/21  Yes [provider]  ?amLODipine (NORVASC) 10 MG tablet Take 10 mg by mouth daily.   Yes [provider]  ?apixaban (ELIQUIS) 5 MG  TABS tablet Take 5 mg by mouth 2 (two) times daily.   Yes [provider]  ?APPLE CIDER VINEGAR PO Take 450 mg by mouth 3 (three) times daily.   Yes [provider]  ?aspirin EC 81 MG tablet Take 81 mg by mouth daily in the afternoon. Swallow whole.   Yes [provider]  ?atorvastatin (LIPITOR) 80 MG tablet Take 80 mg by mouth daily in the afternoon.   Yes [provider]  ?carvedilol (COREG) 25 MG tablet Take 25 mg by mouth 2 (two) times daily with a meal.   Yes [provider]  ?clonazePAM (KLONOPIN) 1 MG tablet Take 1 mg by mouth 3 (three) times daily.   Yes [provider]  ?clotrimazole-betamethasone (LOTRISONE) cream Apply 1 application topically daily as needed for irritation. 01/02/21  Yes [provider]  ?diphenhydrAMINE (BENADRYL) 25 MG  tablet Take 25 mg by mouth daily as needed for allergies.   Yes [provider]  ?docusate sodium (COLACE) 100 MG capsule Take 300 mg by mouth daily.   Yes [provider]  ?doxazosin (CARDURA) 4 MG tablet Take 4 mg by mouth 2 (two) times daily.   Yes [provider]  ?famotidine (PEPCID) 40 MG tablet Take 40 mg by mouth daily. 03/09/21  Yes [provider]  ?fenofibrate 160 MG tablet Take 160 mg by mouth daily.   Yes [provider]  ?fentaNYL (DURAGESIC) 12 MCG/HR Place 1 patch onto the skin every other day. 02/07/20  Yes [provider]  ?ferrous sulfate 325 (65 FE) MG tablet Take 325 mg by mouth daily.   Yes [provider]  ?FIBER PO Take 4 capsules by mouth daily.   Yes [provider]  ?glipiZIDE (GLUCOTROL XL) 10 MG 24 hr tablet Take 10 mg by mouth daily.   Yes [provider]  ?hydrALAZINE (APRESOLINE) 25 MG tablet Take 25 mg by mouth 3 (three) times daily.   Yes [provider]  ?levothyroxine (SYNTHROID) 75 MCG tablet Take 75 mcg by mouth daily before breakfast.   Yes [provider]  ?linagliptin (TRADJENTA) 5 MG TABS tablet Take 5 mg by mouth daily in the afternoon.   Yes [provider]  ?lisinopril (ZESTRIL) 40 MG tablet Take 40 mg by mouth daily.   Yes [provider]  ?Magnesium 400 MG TABS Take 400 mg by mouth daily.   Yes [provider]  ?Melatonin 5 MG CAPS Take 5 mg by mouth at bedtime.   Yes [provider]  ?Multiple Vitamin (MULTIVITAMIN WITH MINERALS) TABS tablet Take 1 tablet by mouth daily.   Yes [provider]  ?naloxone (NARCAN) 4 MG/0.1ML LIQD nasal spray kit Place 1 spray into the nose as needed (opioid overdose).   Yes [provider]  ?Omega-3 Fatty Acids (FISH OIL) 1000 MG CAPS Take 1,000 mg by mouth daily.   Yes [provider]  ?oxyCODONE (OXYCONTIN) 10 mg 12 hr tablet Take 1 tablet (10 mg total) by mouth every 12  (twelve) hours. 02/26/20  Yes Phylliss Bob, MD  ?Potassium Citrate 15 MEQ (1620 MG) TBCR Take 1 tablet by mouth daily.   Yes [provider]  ?Testosterone 1.62 % GEL Place 3 Pump onto the skin every other day.   Yes [provider]  ?traZODone (DESYREL) 50 MG tablet Take 100 mg by mouth at bedtime.   Yes [provider]  ?TURMERIC PO Take 1 tablet by mouth daily.   Yes [provider]  ?Oxycodone HCl 10 MG TABS Take 1 tablet (10 mg total) by mouth every 4 (four) hours as needed. ?Patient not taking: Reported on 04/18/2021 02/26/20   Phylliss Bob, MD  ?predniSONE (DELTASONE) 50 MG tablet Pt to take 50 mg of prednisone on 03/22/21 at 11:30 PM, 50 mg of prednisone on 03/23/21 at 5:30 AM, and 50 mg of prednisone on 03/23/21 at 11:30 AM. Pt is also to take 50 mg of benadryl on 03/23/21 at 11:30 AM. Please call [phone] with any questions. ?Patient not taking: Reported on 04/18/2021 03/14/21   Titus Dubin, MD  ? ? ? ?All other systems have been reviewed and were otherwise negative with the exception of those mentioned in the HPI and as above. ? ?Physical Exam: ?Vitals:  ? 04/27/21 0603  ?BP: (!) 168/59  ?Pulse: 62  ?Resp: 18  ?Temp: 98.3 ?F (36.8 ?C)  ?SpO2: 99%  ? ? ?Body mass index is 27.69 kg/m?. ? ?General: Alert, no acute distress ?Cardiovascular: No pedal edema ?Respiratory: No cyanosis, no use of accessory musculature ?Skin: No lesions in the area of chief complaint ?Neurologic: Sensation intact distally ?Psychiatric: Patient is competent for consent with normal mood and affect ?Lymphatic: No axillary or cervical lymphadenopathy ? ?Assessment/Plan: ?Progressive cervical myelopathy, with cord compression spanning C3-C6 ?Plan for Procedure(s): ?ANTERIOR CERVICAL DECOMPRESSION FUSION CERVICAL 3- CERVICAL 4, CERVICAL 4- CERVICAL 5, CERVICAL 5- CERVICAL 6 WITH INSTRUMENTATION AND ALLOGRAFT ? ? ?Norva Karvonen, MD ?04/27/2021 ?7:31 AM  ?

## 2021-04-27 NOTE — Transfer of Care (Signed)
Immediate Anesthesia Transfer of Care Note ? ?Patient: Adam Jones ? ?Procedure(s) Performed: ANTERIOR CERVICAL DECOMPRESSION FUSION CERVICAL 3- CERVICAL 4, CERVICAL 4- CERVICAL 5, CERVICAL 5- CERVICAL 6 WITH INSTRUMENTATION AND ALLOGRAFT ? ?Patient Location: PACU ? ?Anesthesia Type:General ? ?Level of Consciousness: awake, alert  and oriented ? ?Airway & Oxygen Therapy: Patient Spontanous Breathing ? ?Post-op Assessment: Report given to RN, Post -op Vital signs reviewed and stable and Patient moving all extremities X 4 ? ?Post vital signs: Reviewed and stable ? ?Last Vitals:  ?Vitals Value Taken Time  ?BP 154/71 04/27/21 1132  ?Temp    ?Pulse 85 04/27/21 1134  ?Resp 22 04/27/21 1134  ?SpO2 89 % 04/27/21 1134  ?Vitals shown include unvalidated device data. ? ?Last Pain:  ?Vitals:  ? 04/27/21 0636  ?TempSrc:   ?PainSc: 6   ?   ? ?  ? ?Complications: No notable events documented. ?

## 2021-04-27 NOTE — Anesthesia Postprocedure Evaluation (Signed)
Anesthesia Post Note ? ?Patient: Calum Cormier ? ?Procedure(s) Performed: ANTERIOR CERVICAL DECOMPRESSION FUSION CERVICAL 3- CERVICAL 4, CERVICAL 4- CERVICAL 5, CERVICAL 5- CERVICAL 6 WITH INSTRUMENTATION AND ALLOGRAFT ? ?  ? ?Patient location during evaluation: PACU ?Anesthesia Type: General ?Level of consciousness: awake and alert ?Pain management: pain level controlled ?Vital Signs Assessment: post-procedure vital signs reviewed and stable ?Respiratory status: spontaneous breathing, nonlabored ventilation, respiratory function stable and patient connected to nasal cannula oxygen ?Cardiovascular status: blood pressure returned to baseline and stable ?Postop Assessment: no apparent nausea or vomiting ?Anesthetic complications: no ? ? ?No notable events documented. ? ?Last Vitals:  ?Vitals:  ? 04/27/21 1215 04/27/21 1230  ?BP: (!) 156/71 (!) 154/71  ?Pulse: 87 79  ?Resp: 19 17  ?Temp:    ?SpO2: 98% 97%  ?  ?Last Pain:  ?Vitals:  ? 04/27/21 1230  ?TempSrc:   ?PainSc: 5   ? ? ?  ?  ?  ?  ?  ?  ? ?Terrel Nesheiwat,W. EDMOND ? ? ? ? ?

## 2021-04-28 MED FILL — Thrombin For Soln Kit 20000 Unit: CUTANEOUS | Qty: 1 | Status: AC

## 2021-05-01 NOTE — Discharge Summary (Signed)
? ? ?Patient ID: ?Adam Jones ?MRN: [id number] ?DOB/AGE: 1945-12-27 76 y.o. ? ?Admit date: 04/27/2021 ?Discharge date: 04/27/2021 ? ?Admission Diagnoses:  ?Principal Problem: ?  Radiculomyelopathy ? ? ?Discharge Diagnoses:  ?Same ? ?Past Medical History:  ?Diagnosis Date  ? Anemia   ? Anxiety   ? BPH (benign prostatic hyperplasia)   ? Carotid artery stenosis   ? Cataracts, bilateral   ? Chronic kidney disease   ? Diabetes mellitus without complication ([place])   ? Dysrhythmia   ? GERD (gastroesophageal reflux disease)   ? Hepatitis   ? History of kidney stones   ? Hypercholesteremia   ? Hypertension   ? Hypogonadism in male   ? Hypothyroidism   ? Low back pain   ? Lumbar radiculopathy   ? Osteoarthritis   ? Peripheral vascular disease ([place])   ? Pneumonia   ? RLS (restless legs syndrome)   ? ? ?Surgeries: Procedure(s): ?ANTERIOR CERVICAL DECOMPRESSION FUSION CERVICAL 3- CERVICAL 4, CERVICAL 4- CERVICAL 5, CERVICAL 5- CERVICAL 6 WITH INSTRUMENTATION AND ALLOGRAFT on 04/27/2021 ?  ?Consultants: None ?Discharged Condition: Improved ? ?Hospital Course: Adam Jones is an 76 y.o. male who was admitted 04/27/2021 for operative treatment of Radiculomyelopathy. Patient has severe unremitting pain that affects sleep, daily activities, and work/hobbies. After pre-op clearance the patient was taken to the operating room on 04/27/2021 and underwent  Procedure(s): ?ANTERIOR CERVICAL DECOMPRESSION FUSION CERVICAL 3- CERVICAL 4, CERVICAL 4- CERVICAL 5, CERVICAL 5- CERVICAL 6 WITH INSTRUMENTATION AND ALLOGRAFT.   ? ?Patient was given perioperative antibiotics:  ?Anti-infectives (From admission, onward)  ? ? Start     Dose/Rate Route Frequency Ordered Stop  ? 04/27/21 0600  ceFAZolin (ANCEF) IVPB 2g/100 mL premix       ? 2 g ?200 mL/hr over 30 Minutes Intravenous On call to O.R. 04/27/21 1962 04/27/21 0755  ? ?  ?  ? ?Patient was given sequential compression devices, early ambulation to prevent DVT. ? ?Patient benefited maximally from  hospital stay and there were no complications.   ? ?Recent vital signs: BP (!) 156/71 (BP Location: Left Arm)   Pulse 88   Temp 98 ?F (36.7 ?C)   Resp 15   Ht _0  (1.778 m)   Wt 87.5 kg   SpO2 96%   BMI 27.69 kg/m?   ? ?Discharge Medications:   ?Allergies as of 04/27/2021   ? ?   Reactions  ? Levothyroxine   ? Kept him awake  ? Metformin Other (See Comments)  ? Lactic acidosis  ? Venlafaxine Nausea Only  ? Zolpidem Diarrhea  ? Diclofenac Sodium Nausea And Vomiting  ? Hydrocodone Itching  ?   ? Iodinated Contrast Media Hives, Rash  ? Iodine Rash  ? Betadine is ok no reaction to that  ? ?  ? ?  ?Medication List  ?  ? ?STOP taking these medications   ? ?apixaban 5 MG Tabs tablet ?Commonly known as: ELIQUIS ?  ?aspirin EC 81 MG tablet ?  ?predniSONE 50 MG tablet ?Commonly known as: DELTASONE ?  ? ?  ? ?TAKE these medications   ? ?allopurinol 100 MG tablet ?Commonly known as: ZYLOPRIM ?Take 100 mg by mouth daily. ?  ?amiodarone 200 MG tablet ?Commonly known as: PACERONE ?Take 200 mg by mouth daily. ?  ?amLODipine 10 MG tablet ?Commonly known as: NORVASC ?Take 10 mg by mouth daily. ?  ?APPLE CIDER VINEGAR PO ?Take 450 mg by mouth 3 (three) times daily. ?  ?atorvastatin  80 MG tablet ?Commonly known as: LIPITOR ?Take 80 mg by mouth daily in the afternoon. ?  ?carvedilol 25 MG tablet ?Commonly known as: COREG ?Take 25 mg by mouth 2 (two) times daily with a meal. ?  ?clonazePAM 1 MG tablet ?Commonly known as: KLONOPIN ?Take 1 mg by mouth 3 (three) times daily. ?  ?clotrimazole-betamethasone cream ?Commonly known as: LOTRISONE ?Apply 1 application topically daily as needed for irritation. ?  ?diphenhydrAMINE 25 MG tablet ?Commonly known as: BENADRYL ?Take 25 mg by mouth daily as needed for allergies. ?  ?docusate sodium 100 MG capsule ?Commonly known as: COLACE ?Take 300 mg by mouth daily. ?  ?doxazosin 4 MG tablet ?Commonly known as: CARDURA ?Take 4 mg by mouth 2 (two) times daily. ?  ?famotidine 40 MG  tablet ?Commonly known as: PEPCID ?Take 40 mg by mouth daily. ?  ?fenofibrate 160 MG tablet ?Take 160 mg by mouth daily. ?  ?fentaNYL 12 MCG/HR ?Commonly known as: [place] ?Place 1 patch onto the skin every other day. ?  ?ferrous sulfate 325 (65 FE) MG tablet ?Take 325 mg by mouth daily. ?  ?FIBER PO ?Take 4 capsules by mouth daily. ?  ?Fish Oil 1000 MG Caps ?Take 1,000 mg by mouth daily. ?  ?glipiZIDE 10 MG 24 hr tablet ?Commonly known as: GLUCOTROL XL ?Take 10 mg by mouth daily. ?  ?hydrALAZINE 25 MG tablet ?Commonly known as: APRESOLINE ?Take 25 mg by mouth 3 (three) times daily. ?  ?levothyroxine 75 MCG tablet ?Commonly known as: SYNTHROID ?Take 75 mcg by mouth daily before breakfast. ?  ?linagliptin 5 MG Tabs tablet ?Commonly known as: TRADJENTA ?Take 5 mg by mouth daily in the afternoon. ?  ?lisinopril 40 MG tablet ?Commonly known as: ZESTRIL ?Take 40 mg by mouth daily. ?  ?Magnesium 400 MG Tabs ?Take 400 mg by mouth daily. ?  ?Melatonin 5 MG Caps ?Take 5 mg by mouth at bedtime. ?  ?methocarbamol 500 MG tablet ?Commonly known as: Robaxin ?Take 1-2 tablets (500-1,000 mg total) by mouth every 6 (six) hours as needed for muscle spasms. ?  ?multivitamin with minerals Tabs tablet ?Take 1 tablet by mouth daily. ?  ?Narcan 4 MG/0.1ML Liqd nasal spray kit ?Generic drug: naloxone ?Place 1 spray into the nose as needed (opioid overdose). ?  ?oxyCODONE 10 mg 12 hr tablet ?Commonly known as: OXYCONTIN ?Take 1 tablet (10 mg total) by mouth every 12 (twelve) hours. ?  ?Oxycodone HCl 10 MG Tabs ?Take 1 tablet (10 mg total) by mouth every 4 (four) hours as needed. ?  ?Potassium Citrate 15 MEQ (1620 MG) Tbcr ?Take 1 tablet by mouth daily. ?  ?Testosterone 1.62 % Gel ?Place 3 Pump onto the skin every other day. ?  ?traZODone 50 MG tablet ?Commonly known as: DESYREL ?Take 100 mg by mouth at bedtime. ?  ?TURMERIC PO ?Take 1 tablet by mouth daily. ?  ? ?  ? ? ?Diagnostic Studies: DG Cervical Spine 1 View ? ?Result Date:  04/27/2021 ?CLINICAL DATA:  Anterior cervical decompression and fusion, C3-C4 through C5-C6 EXAM: DG CERVICAL SPINE - 1 VIEW COMPARISON:  None. FINDINGS: A single C-arm fluoroscopic images was obtained intraoperatively and submitted for post operative interpretation. Postsurgical changes reflecting ACDF are noted which appear to extend from C3 through C6. The lower hardware is suboptimally evaluated, but the hardware is grossly within expected limits, without evidence of immediate complication. Fluoro time 15 seconds, dose 5.59 mGy. Please see the performing provider's procedural report for further  detail. IMPRESSION: Status post C3 through C6 ACDF. Electronically Signed   By: Valetta Mole M.D.   On: 04/27/2021 11:29   ? ?Disposition: Discharge disposition: 01-Home or Self Care ? ? ? ? ? ? ?Discharge Instructions   ? ? Discharge patient   Complete by: As directed ?  ? Pt expected to D/C home day of surgery  ? Discharge disposition: 01-Home or Self Care  ? Discharge patient date: 04/27/2021  ? ?  ? ?Pt doing excellent, has been up and walking, will D/C home ? ?-Scripts for pain sent to pharmacy electronically  ?-D/C instructions sheet printed and in chart ?-D/C today  ?-F/U in office 2 weeks  ? ?Signed: ?Lennie Muckle Ryley Bachtel ?05/01/2021, 5:07 PM ? ? ? ? ? ?  ?

## 2021-05-03 ENCOUNTER — Encounter (HOSPITAL_COMMUNITY): Payer: Self-pay | Admitting: Orthopedic Surgery

## 2022-05-15 ENCOUNTER — Other Ambulatory Visit: Payer: Self-pay | Admitting: Orthopedic Surgery

## 2022-05-15 DIAGNOSIS — M545 Low back pain, unspecified: Secondary | ICD-10-CM

## 2022-08-10 ENCOUNTER — Encounter: Payer: Self-pay | Admitting: Orthopedic Surgery

## 2022-08-11 ENCOUNTER — Ambulatory Visit
Admission: RE | Admit: 2022-08-11 | Discharge: 2022-08-11 | Disposition: A | Discharge status: Home / Self Care | Payer: Medicare HMO | Source: Ambulatory Visit | Attending: Orthopedic Surgery | Admitting: Orthopedic Surgery

## 2022-08-11 DIAGNOSIS — M545 Low back pain, unspecified: Secondary | ICD-10-CM

## 2022-09-13 ENCOUNTER — Encounter: Payer: Self-pay | Admitting: Orthopedic Surgery

## 2022-09-14 ENCOUNTER — Other Ambulatory Visit: Payer: Self-pay | Admitting: Orthopedic Surgery

## 2022-09-14 DIAGNOSIS — M545 Low back pain, unspecified: Secondary | ICD-10-CM

## 2022-09-26 ENCOUNTER — Encounter: Payer: Self-pay | Admitting: Orthopedic Surgery

## 2022-09-28 ENCOUNTER — Telehealth: Payer: Self-pay

## 2022-09-28 NOTE — Telephone Encounter (Signed)
Phone call to patient to review instructions for 13 hr prep for SI joint injection w/ contrast on 10/01/22 at 8:30. Prescription called into [facility] in [place]. Pt aware and verbalized understanding of instructions.   Prescription: Pt to take 50 mg of prednisone on 09/30/22 at 7:30PM, 50 mg of prednisone on 10/01/22 at 1:30 AM, and 50 mg of prednisone on 10/01/22 at 7:30 AM. Pt is also to take 50 mg of benadryl on 10/01/22 at 7:30 AM. Please call [phone] with any questions.

## 2022-10-01 ENCOUNTER — Ambulatory Visit: Admission: RE | Admit: 2022-10-01 | Payer: Medicare HMO | Source: Ambulatory Visit

## 2022-10-01 ENCOUNTER — Ambulatory Visit
Admission: RE | Admit: 2022-10-01 | Discharge: 2022-10-01 | Disposition: A | Discharge status: Home / Self Care | Payer: Medicare HMO | Source: Ambulatory Visit | Attending: Orthopedic Surgery | Admitting: Orthopedic Surgery

## 2022-10-01 DIAGNOSIS — M545 Low back pain, unspecified: Secondary | ICD-10-CM

## 2023-12-23 IMAGING — CT CT BIOPSY
1 of 3 series · 14 of 32 positions shown, 19 images · non-contrast
Comparison: none

CLINICAL DATA: Previous lumbar fusion.  Left low back pain.

[Series 2: needle -guided injection · axial · 0.69mm/px · z∈[-140,-24]mm · 14 of 66 slices shown, 19 images]
[im 4/66  soft-tissue]
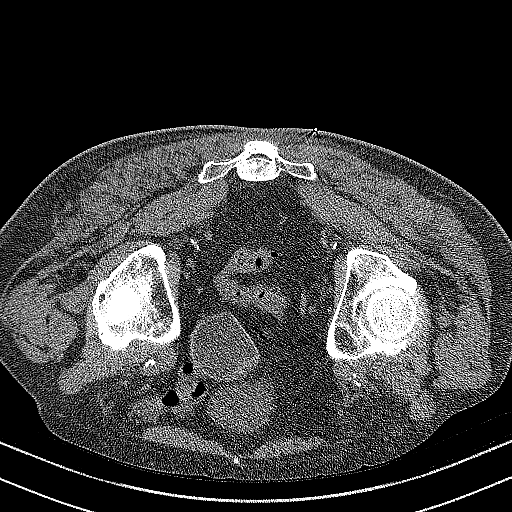
[im 4/66  bone]
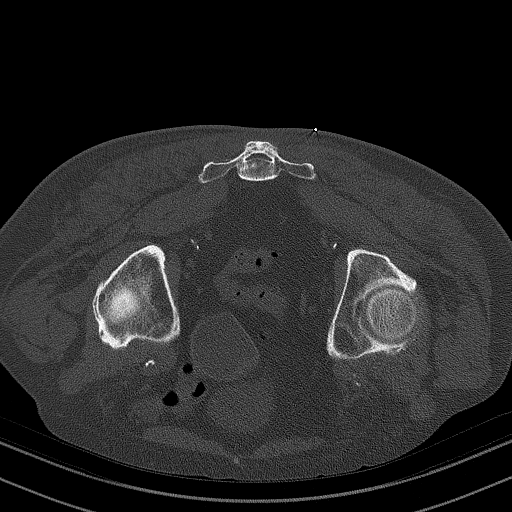
[im 10/66  soft-tissue]
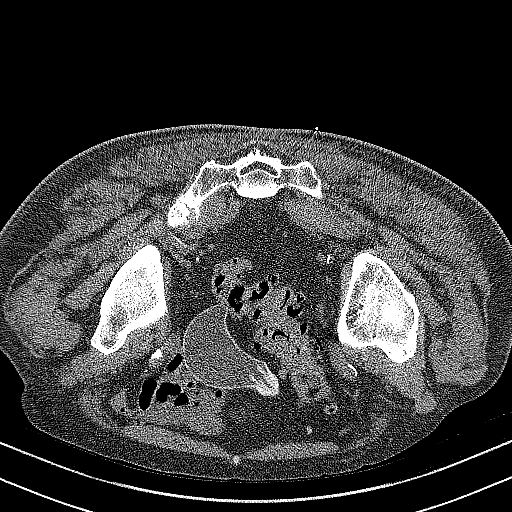
[im 13/66  soft-tissue]
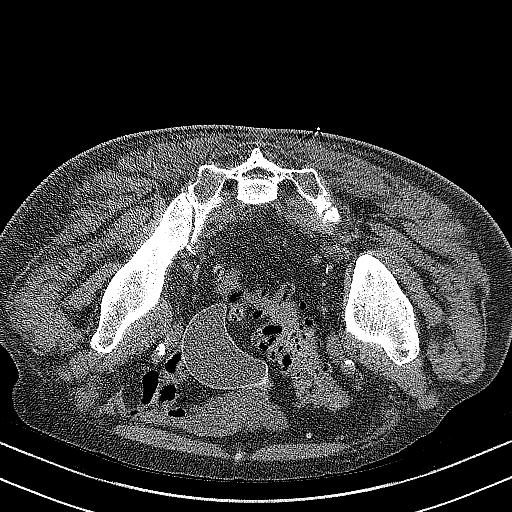
[im 19/66  soft-tissue]
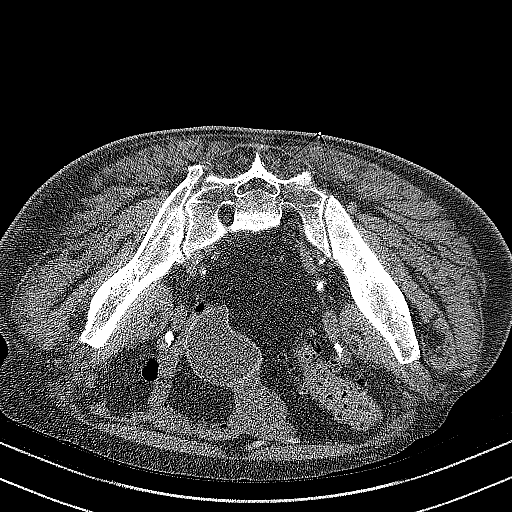
[im 22/66  soft-tissue]
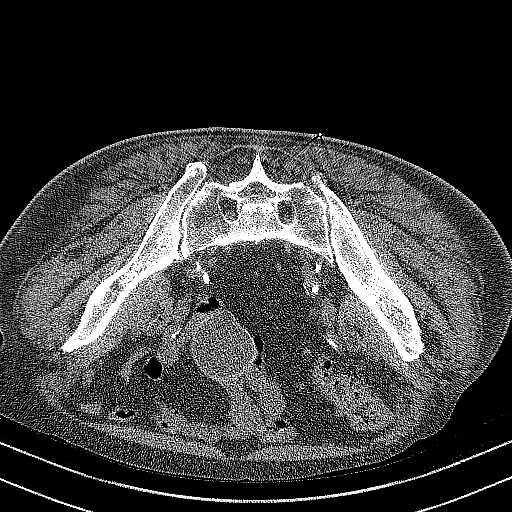
[im 28/66  soft-tissue]
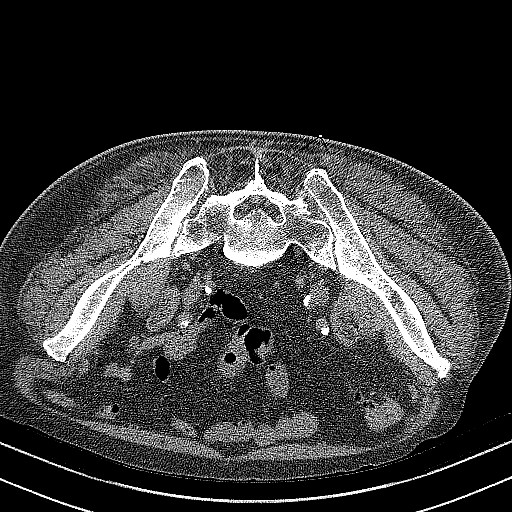
[im 31/66  soft-tissue]
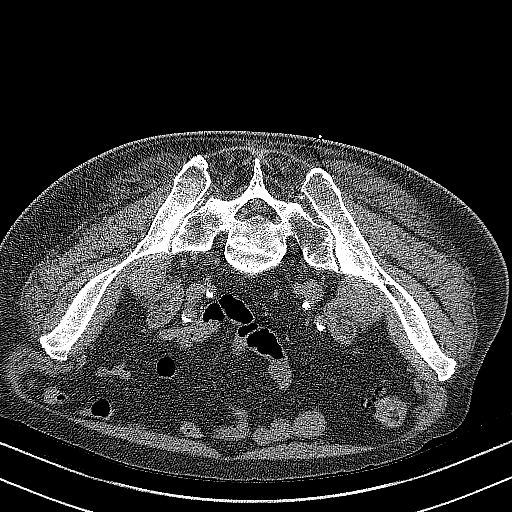
[im 35/66  soft-tissue]
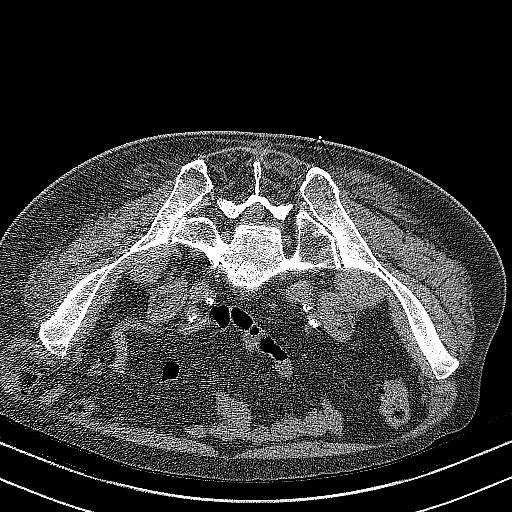
[im 41/66  soft-tissue]
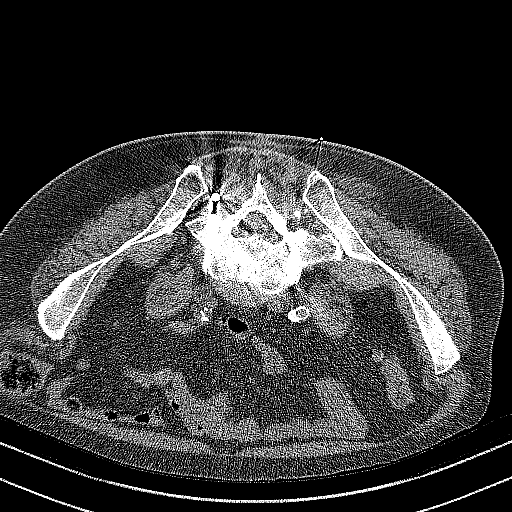
[im 41/66  bone]
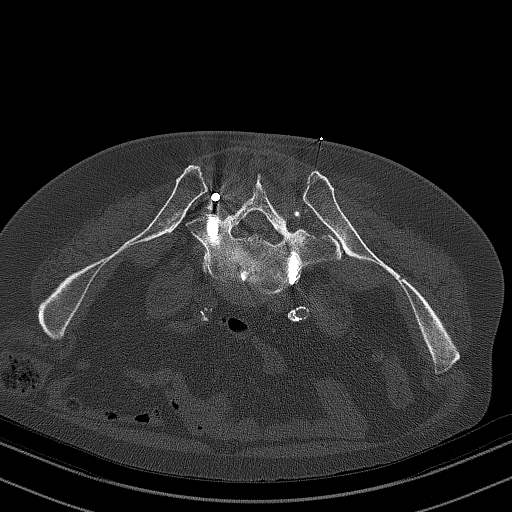
[im 44/66  soft-tissue]
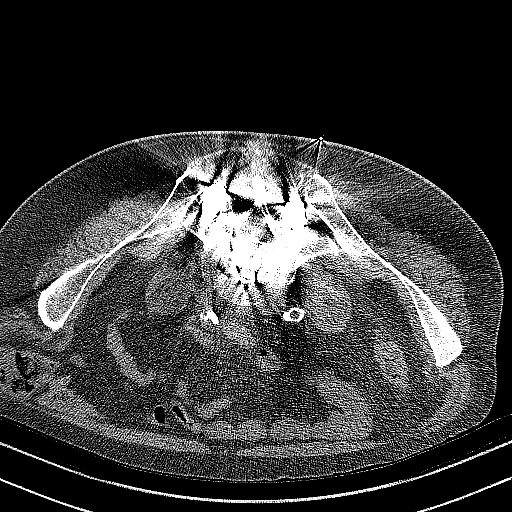
[im 53/66  soft-tissue]
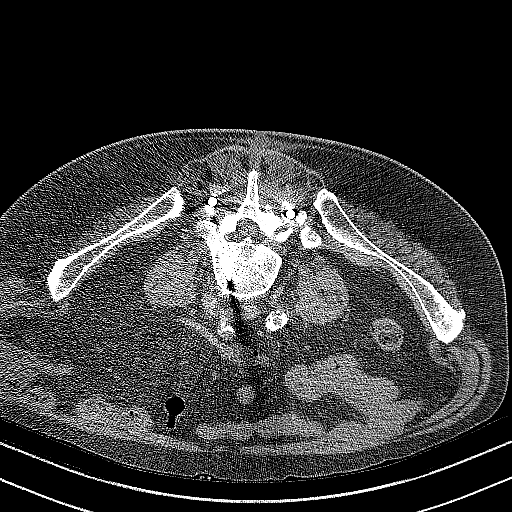
[im 53/66  lung]
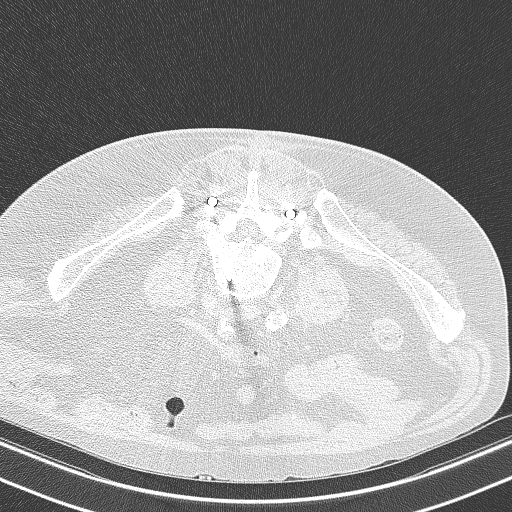
[im 56/66  soft-tissue]
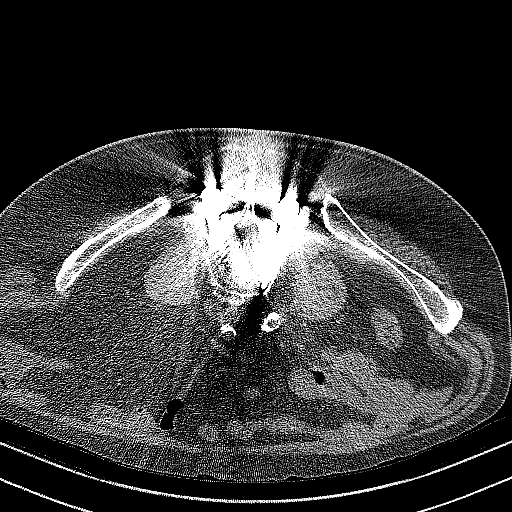
[im 56/66  lung]
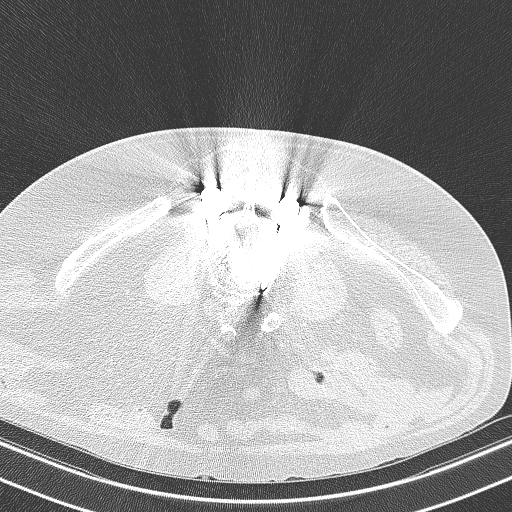
[im 59/66  lung]
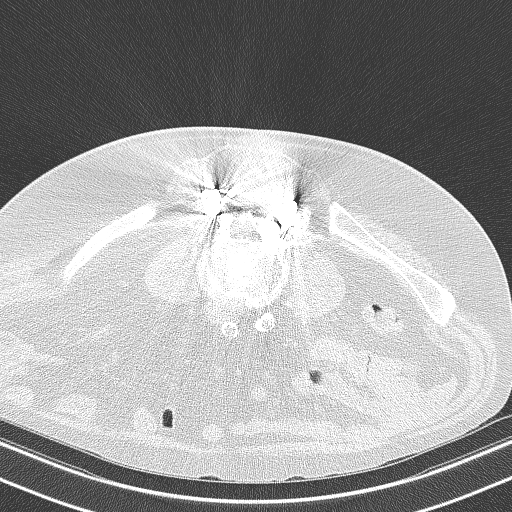
[im 62/66  soft-tissue]
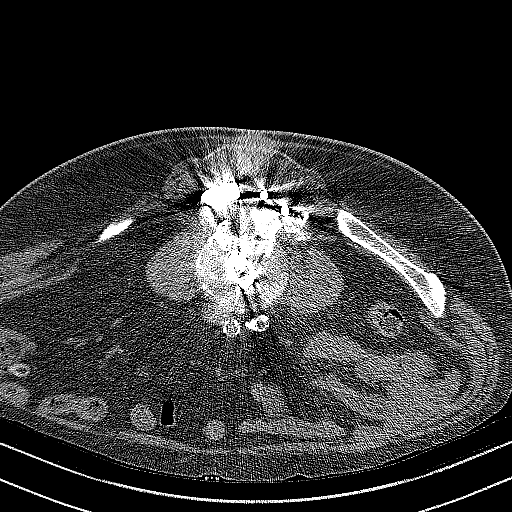
[im 62/66  lung]
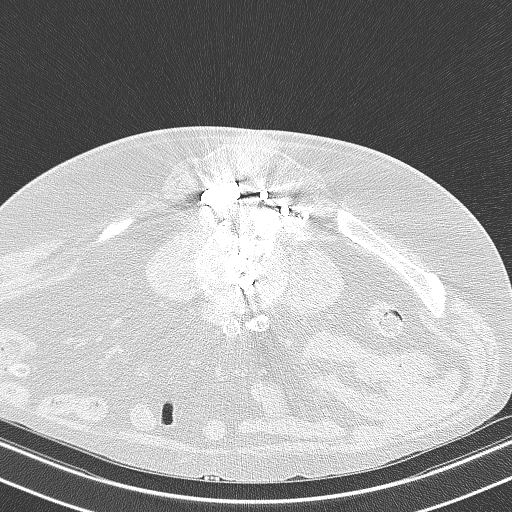

[14 of 32 positions shown; findings below may reference images not displayed]

EXAM:
LEFT CT GUIDED SI JOINT INJECTION



After local anesthesia with 1% lidocaine without epinephrine and
subsequent deep anesthesia, a 3.5 inch 22 gauge spinal needle was
advanced into the posteroinferior aspect of the left SI joint under
intermittent CT guidance.

Once the needle was in satisfactory position, representative image
was captured with the needle demonstrated in the sacroiliac joint.
Subsequently, 3 mL bupivacaine 0.5% was injected into the left SI
joint. Needles removed and a sterile dressing applied.

No complications were observed.
IMPRESSION: Successful CT-guided left SI joint injection.

## 2024-01-27 IMAGING — RF DG CERVICAL SPINE 1V
1 series · 1 of 1 positions shown · non-contrast
Comparison: None.

CLINICAL DATA: Anterior cervical decompression and fusion, C3-C4
through C5-C6

EXAM:
DG CERVICAL SPINE - 1 VIEW

[Series 1: run · 1 of 1 slices shown]
[im 1/1]
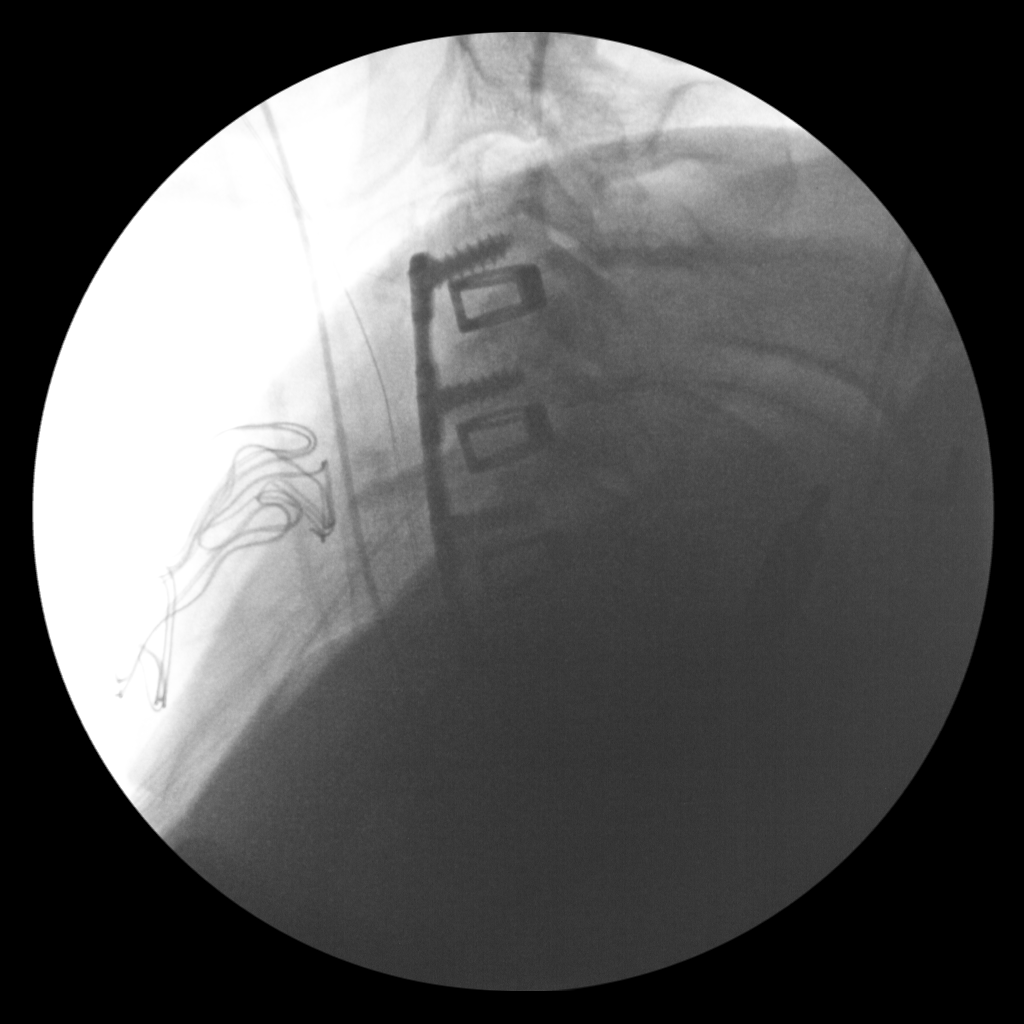

[1 of 1 positions shown; findings below may reference images not displayed]

FINDINGS: A single C-arm fluoroscopic images was obtained intraoperatively and
submitted for post operative interpretation. Postsurgical changes
reflecting ACDF are noted which appear to extend from C3 through C6.
The lower hardware is suboptimally evaluated, but the hardware is
grossly within expected limits, without evidence of immediate
complication. Fluoro time 15 seconds, dose 5.59 mGy. Please see the
performing provider's procedural report for further detail.
IMPRESSION: Status post C3 through C6 ACDF.
